# Patient Record
Sex: Female | Born: 1980 | Hispanic: No | Marital: Married | Smoking: Never smoker
Health system: Southern US, Community
[De-identification: ages and names within clinical notes are randomized; demographics above are authoritative.]

## PROBLEM LIST (undated history)

## (undated) ENCOUNTER — Inpatient Hospital Stay (HOSPITAL_COMMUNITY): Payer: Self-pay

## (undated) DIAGNOSIS — Z789 Other specified health status: Secondary | ICD-10-CM

## (undated) HISTORY — PX: NO PAST SURGERIES: SHX2092

---

## 2015-07-29 ENCOUNTER — Ambulatory Visit (INDEPENDENT_AMBULATORY_CARE_PROVIDER_SITE_OTHER): Payer: Medicaid Other | Admitting: Certified Nurse Midwife

## 2015-07-29 ENCOUNTER — Encounter: Payer: Self-pay | Admitting: Certified Nurse Midwife

## 2015-07-29 VITALS — BP 119/80 | HR 98 | Temp 98.7°F | Wt 124.0 lb

## 2015-07-29 DIAGNOSIS — O0932 Supervision of pregnancy with insufficient antenatal care, second trimester: Secondary | ICD-10-CM

## 2015-07-29 DIAGNOSIS — Z3492 Encounter for supervision of normal pregnancy, unspecified, second trimester: Secondary | ICD-10-CM

## 2015-07-29 DIAGNOSIS — O09519 Supervision of elderly primigravida, unspecified trimester: Secondary | ICD-10-CM | POA: Insufficient documentation

## 2015-07-29 DIAGNOSIS — O09512 Supervision of elderly primigravida, second trimester: Secondary | ICD-10-CM

## 2015-07-29 DIAGNOSIS — Z3402 Encounter for supervision of normal first pregnancy, second trimester: Secondary | ICD-10-CM

## 2015-07-29 LAB — POCT URINALYSIS DIPSTICK
Bilirubin, UA: NEGATIVE
Glucose, UA: NEGATIVE
KETONES UA: NEGATIVE
Leukocytes, UA: NEGATIVE
Nitrite, UA: NEGATIVE
PROTEIN UA: NEGATIVE
RBC UA: NEGATIVE
SPEC GRAV UA: 1.015
Urobilinogen, UA: NEGATIVE
pH, UA: 6

## 2015-07-29 MED ORDER — ONDANSETRON HCL 8 MG PO TABS: 8.0000 mg | ORAL_TABLET | Freq: Three times a day (TID) | ORAL | Status: DC | PRN

## 2015-07-29 MED ORDER — VITAFOL FE+ 90-1-200 & 50 MG PO CPPK: 2.0000 | ORAL_CAPSULE | Freq: Every day | ORAL | Status: AC

## 2015-07-29 NOTE — Progress Notes (Signed)
Subjective:    [name] [name] is being seen today for her first obstetrical visit.  This is not a planned pregnancy. She is at unknown gestation. Her obstetrical history is significant for advanced maternal age. Relationship with FOB: spouse, living together. Patient does not intend to breast feed. Pregnancy history fully reviewed.  Has been in [place] for about 3-4 months, came here in December 2016.    The information documented in the HPI was reviewed and verified.  Menstrual History: OB History    Gravida Para Term Preterm AB TAB SAB Ectopic Multiple Living   1 0 0 0 0 0 0 0 0       Menarche age: 35 years of age.    Patient's last menstrual period was 03/24/2015 (approximate).    No past medical history on file.  No past surgical history on file.   (Not in a hospital admission) No Known Allergies  Social History  Substance Use Topics  . Smoking status: Not on file  . Smokeless tobacco: Not on file  . Alcohol Use: Not on file    No family history on file.   Review of Systems Constitutional: negative for weight loss Gastrointestinal: negative for vomiting, + nausea Genitourinary:negative for genital lesions and vaginal discharge and dysuria Musculoskeletal:negative for back pain Behavioral/Psych: negative for abusive relationship, depression, illegal drug usage and tobacco use    Objective:    BP 119/80 mmHg  Pulse 98  Temp(Src) 98.7 F (37.1 C)  Wt 124 lb (56.246 kg)  LMP 03/24/2015 (Approximate) General Appearance:    Alert, cooperative, no distress, appears stated age  Head:    Normocephalic, without obvious abnormality, atraumatic  Eyes:    PERRL, conjunctiva/corneas clear, EOM's intact, fundi    benign, both eyes  Ears:    Normal TM's and external ear canals, both ears  Nose:   Nares normal, septum midline, mucosa normal, no drainage    or sinus tenderness  Throat:   Lips, mucosa, and tongue normal; teeth and gums normal  Neck:   Supple, symmetrical, trachea midline,  no adenopathy;    thyroid:  no enlargement/tenderness/nodules; no carotid   bruit or JVD  Back:     Symmetric, no curvature, ROM normal, no CVA tenderness  Lungs:     Clear to auscultation bilaterally, respirations unlabored  Chest Wall:    No tenderness or deformity   Heart:    Regular rate and rhythm, S1 and S2 normal, no murmur, rub   or gallop  Breast Exam:    No tenderness, masses, or nipple abnormality  Abdomen:     Soft, non-tender, bowel sounds active all four quadrants,    no masses, no organomegaly  Genitalia:    Normal female without lesion, discharge or tenderness  Extremities:   Extremities normal, atraumatic, no cyanosis or edema  Pulses:   2+ and symmetric all extremities  Skin:   Skin color, texture, turgor normal, no rashes or lesions  Lymph nodes:   Cervical, supraclavicular, and axillary nodes normal  Neurologic:   CNII-XII intact, normal strength, sensation and reflexes    throughout         Cervix:  Long, thick, closed and posterior.   FHR: 150, FH: 18-20cm @U .     Lab Review Urine pregnancy test Labs reviewed no Radiologic studies reviewed no Assessment:    Pregnancy at unknown weeks   Late to prenatal care  Immigration this year from [place], non english speaking  AMA   Plan:  Prenatal vitamins.  Counseling provided regarding continued use of seat belts, cessation of alcohol consumption, smoking or use of illicit drugs; infection precautions i.e., influenza/TDAP immunizations, toxoplasmosis,CMV, parvovirus, listeria and varicella; workplace safety, exercise during pregnancy; routine dental care, safe medications, sexual activity, hot tubs, saunas, pools, travel, caffeine use, fish and methlymercury, potential toxins, hair treatments, varicose veins Weight gain recommendations per IOM guidelines reviewed: underweight/BMI< 18.5--> gain 28 - 40 lbs; normal weight/BMI 18.5 - 24.9--> gain 25 - 35 lbs; overweight/BMI 25 - 29.9--> gain 15 - 25 lbs; obese/BMI  >30->gain  11 - 20 lbs Problem list reviewed and updated. FIRST/CF mutation testing/NIPT/QUAD SCREEN/fragile X/Ashkenazi Jewish population testing/Spinal muscular atrophy discussed: requested. Role of ultrasound in pregnancy discussed; fetal survey: requested. Amniocentesis discussed: not indicated. VBAC calculator score: VBAC consent form provided Meds ordered this encounter  Medications  . Prenat-FePoly-Metf-FA-DHA-DSS (VITAFOL FE+) 90-1-200 & 50 MG CPPK    Sig: Take 2 tablets by mouth daily.    Dispense:  60 each    Refill:  12  . ondansetron (ZOFRAN) 8 MG tablet    Sig: Take 1 tablet (8 mg total) by mouth every 8 (eight) hours as needed for nausea or vomiting.    Dispense:  40 tablet    Refill:  2   Orders Placed This Encounter  Procedures  . Culture, OB Urine  . US OB Comp + 14 Wk    Standing Status: Future     Number of Occurrences:      Standing Expiration Date: 09/27/2016    Order Specific Question:  Reason for Exam (SYMPTOM  OR DIAGNOSIS REQUIRED)    Answer:  fetal anatomy scAN, DATING    Order Specific Question:  Preferred imaging location?    Answer:  Internal  . US MFM OB DETAIL +14 WK    Standing Status: Future     Number of Occurrences:      Standing Expiration Date: 09/27/2016    Order Specific Question:  Reason for Exam (SYMPTOM  OR DIAGNOSIS REQUIRED)    Answer:  fetal anatomy scan, dating    Order Specific Question:  Preferred imaging location?    Answer:  [facility]-Ultrasound  . Prenatal Profile I  . Varicella zoster antibody, IgG  . Hemoglobinopathy evaluation  . HIV antibody  . TSH  . MaterniT21 PLUS Core+SCA    Order Specific Question:  Is the patient insulin dependent?    Answer:  No    Order Specific Question:  Please enter gestational age. This should be expressed as weeks AND days, i.e. 16w 6d. Enter weeks here. Enter days in next question.    Answer:  6918     Comments:  does not have US yet    Order Specific Question:  Please enter gestational age. This  should be expressed as weeks AND days, i.e. 16w 6d. Enter days here. Enter weeks in previous question.    Answer:  1    Order Specific Question:  How was gestational age calculated?    Answer:  LMP     Comments:  fundal height    Order Specific Question:  Please give the date of LMP OR Ultrasound OR Estimated date of delivery.    Answer:  03/24/2015    Order Specific Question:  Number of Fetuses (Type of Pregnancy):    Answer:  1    Order Specific Question:  Indications for performing the test? (please choose all that apply):    Answer:  Advanced maternal age    Order  Specific Question:  Other Indications? (Y=Yes, N=No)    Answer:  Y    Order Specific Question:  Please specify other indications, if any:    Answer:  [place], new to [place]    Order Specific Question:  If this is a repeat specimen, please indicate the reason:    Answer:  Not indicated    Order Specific Question:  Please specify the patient's race: (C=White/Caucasion, B=Black, I=Native American, A=Asian, H=Hispanic, O=Other, U=Unknown)    Answer:  O    Order Specific Question:  Donor Egg - indicate if the egg was obtained from in vitro fertilization.    Answer:  N    Order Specific Question:  Age of Egg Donor.    Answer:  70    Order Specific Question:  Prior Down Syndrome/ONTD screening during current pregnancy.    Answer:  N    Order Specific Question:  Prior First Trimester Testing    Answer:  N    Order Specific Question:  Prior Second Trimester Testing    Answer:  N    Order Specific Question:  Family History of Neural Tube Defects    Answer:  N    Order Specific Question:  Prior Pregnancy with Down Syndrome    Answer:  N    Order Specific Question:  Please give the patient's weight (in pounds)    Answer:  124  . POCT Urinalysis Dipstick    Follow up in 4 weeks. 50% of 30 min visit spent on counseling and coordination of care.

## 2015-07-30 LAB — HIV ANTIBODY (ROUTINE TESTING W REFLEX): HIV Screen 4th Generation wRfx: NONREACTIVE

## 2015-07-30 LAB — PRENATAL PROFILE I(LABCORP)
Antibody Screen: NEGATIVE
BASOS ABS: 0 10*3/uL (ref 0.0–0.2)
Basos: 0 %
EOS (ABSOLUTE): 1.5 10*3/uL — ABNORMAL HIGH (ref 0.0–0.4)
Eos: 15 %
HEMOGLOBIN: 12.3 g/dL (ref 11.1–15.9)
Hematocrit: 36.3 % (ref 34.0–46.6)
Hepatitis B Surface Ag: NEGATIVE
Immature Grans (Abs): 0 10*3/uL (ref 0.0–0.1)
Immature Granulocytes: 0 %
LYMPHS ABS: 1.5 10*3/uL (ref 0.7–3.1)
Lymphs: 15 %
MCH: 30.2 pg (ref 26.6–33.0)
MCHC: 33.9 g/dL (ref 31.5–35.7)
MCV: 89 fL (ref 79–97)
MONOS ABS: 0.5 10*3/uL (ref 0.1–0.9)
Monocytes: 5 %
NEUTROS ABS: 6.5 10*3/uL (ref 1.4–7.0)
Neutrophils: 65 %
PLATELETS: 240 10*3/uL (ref 150–379)
RBC: 4.07 x10E6/uL (ref 3.77–5.28)
RDW: 13.1 % (ref 12.3–15.4)
RH TYPE: POSITIVE
RPR Ser Ql: NONREACTIVE
Rubella Antibodies, IGG: 6.93 index (ref >0.99)
WBC: 10.1 10*3/uL (ref 3.4–10.8)

## 2015-07-30 LAB — VARICELLA ZOSTER ANTIBODY, IGG: Varicella zoster IgG: 386 index (ref >165)

## 2015-07-30 LAB — HEMOGLOBINOPATHY EVALUATION
HEMOGLOBIN A2 QUANTITATION: 2.7 % (ref 0.7–3.1)
HEMOGLOBIN F QUANTITATION: 0 % (ref 0.0–2.0)
HGB C: 0 %
HGB S: 0 %
Hgb A: 97.3 % (ref 94.0–98.0)

## 2015-07-30 LAB — TSH: TSH: 3.06 u[IU]/mL (ref 0.450–4.500)

## 2015-07-30 NOTE — Addendum Note (Signed)
Addended by: Marya LandryFOSTER, Jenisis Harmsen D on: 07/30/2015 09:34 AM   Modules accepted: Orders

## 2015-07-31 ENCOUNTER — Encounter (HOSPITAL_COMMUNITY): Payer: Self-pay | Admitting: Certified Nurse Midwife

## 2015-07-31 LAB — CULTURE, OB URINE

## 2015-07-31 LAB — URINE CULTURE, OB REFLEX: Organism ID, Bacteria: NO GROWTH

## 2015-08-02 LAB — PAP IG AND HPV HIGH-RISK
HPV, high-risk: NEGATIVE
PAP Smear Comment: 0

## 2015-08-03 LAB — NUSWAB VG+, CANDIDA 6SP
CANDIDA ALBICANS, NAA: NEGATIVE
CANDIDA KRUSEI, NAA: NEGATIVE
Candida glabrata, NAA: NEGATIVE
Candida lusitaniae, NAA: NEGATIVE
Candida parapsilosis, NAA: NEGATIVE
Candida tropicalis, NAA: NEGATIVE
Chlamydia trachomatis, NAA: NEGATIVE
Neisseria gonorrhoeae, NAA: NEGATIVE
Trich vag by NAA: NEGATIVE

## 2015-08-05 LAB — MATERNIT21 PLUS CORE+SCA
CHROMOSOME 18: NEGATIVE
CHROMOSOME 21: NEGATIVE
Chromosome 13: NEGATIVE
PDF: 0
Y Chromosome: NOT DETECTED

## 2015-08-06 ENCOUNTER — Other Ambulatory Visit: Payer: Self-pay | Admitting: Certified Nurse Midwife

## 2015-08-08 ENCOUNTER — Encounter (HOSPITAL_COMMUNITY): Payer: Self-pay

## 2015-08-11 ENCOUNTER — Ambulatory Visit (HOSPITAL_COMMUNITY)
Admission: RE | Admit: 2015-08-11 | Discharge: 2015-08-11 | Disposition: A | Discharge status: Home / Self Care | Payer: Medicaid Other | Source: Ambulatory Visit | Attending: Certified Nurse Midwife | Admitting: Certified Nurse Midwife

## 2015-08-11 ENCOUNTER — Other Ambulatory Visit: Payer: Self-pay | Admitting: Certified Nurse Midwife

## 2015-08-11 ENCOUNTER — Encounter (HOSPITAL_COMMUNITY): Payer: Self-pay

## 2015-08-11 DIAGNOSIS — Z315 Encounter for genetic counseling: Secondary | ICD-10-CM | POA: Insufficient documentation

## 2015-08-11 DIAGNOSIS — Z3A20 20 weeks gestation of pregnancy: Secondary | ICD-10-CM | POA: Diagnosis not present

## 2015-08-11 DIAGNOSIS — Z3689 Encounter for other specified antenatal screening: Secondary | ICD-10-CM

## 2015-08-11 DIAGNOSIS — O09512 Supervision of elderly primigravida, second trimester: Secondary | ICD-10-CM

## 2015-08-11 DIAGNOSIS — Z36 Encounter for antenatal screening of mother: Secondary | ICD-10-CM | POA: Insufficient documentation

## 2015-08-11 NOTE — Progress Notes (Signed)
Genetic Counseling  High-Risk Gestation Note  Appointment Date:  08/11/2015 Referred By: Roe Coombsenney, Rachelle A, CNM Date of Birth:  January 20, 1981 Partner: Elmo PuttBlon [name]   Pregnancy History: G1P0000 Estimated Date of Delivery: 12/29/15 Estimated Gestational Age: 4132w0d Attending: Damaris HippoJeffrey Denney, MD  [name] and her husband, Mr. [name] [name], were seen for genetic counseling because of a maternal age of 35 y.o.Marland Kitchen. Vietnamese/English interpreter, [name] [name], from [employer] provided interpretation for today's visit.     In summary:  Discussed AMA and associated risk for fetal aneuploidy  Reviewed results of noninvasive prenatal screening (MaterniT21) through OB, which were within normal limits  Discussed additional option for screening  Ultrasound- performed today, within normal limits  Discussed diagnostic testing options  Amniocentesis-patient declined  Reviewed family history concerns: Advanced paternal age (37 years old)  Discussed carrier screening options (including CF)- couple declined additional, optional carrier screening at this time  They were counseled regarding maternal age and the association with risk for chromosome conditions due to nondisjunction with aging of the ova.   We reviewed chromosomes, nondisjunction, and the associated 1 in 2988 risk for fetal aneuploidy related to a maternal age of 35 years old at delivery.  They were counseled that the risk for aneuploidy decreases as gestational age increases, accounting for those pregnancies which spontaneously abort.  We specifically discussed Down syndrome (trisomy 6121), trisomies 5113 and 3318, and sex chromosome aneuploidies (47,XXX and 47,XXY) including the common features and prognoses of each.   We also reviewed the results of [name]'s non-invasive prenatal screening (NIPS), MaterniT21 performed through her OB office.  We discussed that NIPS analyzes placental cell free DNA in maternal circulation to evaluate for the  presence of extra chromosome conditions.  Thus, it is able to provide risk assessment for specific chromosome conditions, but is not diagnostic.  Based on her test, the chance for her baby to have aneuploidy for chromosomes 13, 18 or 21 was reduced to less than 1 in [id number], and the risk for sex chromosome aneuploidy was low risk.  Detailed ultrasound was performed today. Complete ultrasound results reported under separate cover. They were counseled that 50-80% of fetuses with Down syndrome and up to 90% of fetuses with trisomies 13 and 18, when well visualized, have detectable anomalies or soft markers by ultrasound.  We reviewed that ultrasound does not diagnose or rule out chromosome conditions.   We also discussed the availability of diagnostic testing by way of amniocentesis.  We reviewed the risks, benefits and limitations of amniocentesis including the approximate 1 in 300-500 risk for pregnancy complications following amniocentesis. We discussed the possible results that the tests might provide including: positive, negative, unanticipated, and no result. Finally, they were counseled regarding the cost of each option and potential out of pocket expenses. After reviewing the above results and the available options, [name] expressed that she is not interested in pursuing diagnostic testing at this time or in the future, given the associated risk of complications and given that the screening performed thus far have been within normal range.  They understand that ultrasound and NIPS cannot rule out all birth defects or genetic syndromes.    [name] was provided with written information regarding cystic fibrosis (CF) including the carrier frequency and incidence in the Asian population, the availability of carrier testing and prenatal diagnosis if indicated.  In addition, we discussed that CF is routinely screened for as part of the [place] newborn screening panel.  She declined  CF testing today.    Both family histories were reviewed and found to be noncontributory for birth defects, intellectual disability, and known genetic conditions. Without further information regarding the provided family history, an accurate genetic risk cannot be calculated. Further genetic counseling is warranted if more information is obtained.  The father of the pregnancy is 66 years old. Advanced paternal age (APA) is defined as paternal age greater than or equal to age 10.  Recent large-scale sequencing studies have shown that approximately 80% of de novo point mutations are of paternal origin.  Many studies have demonstrated a strong correlation between increased paternal age and de novo point mutations.  Although no specific data is available regarding fetal risks for fathers 66+ years old at conception, it is apparent that the overall risk for single gene conditions is increased.  To estimate the relative increase in risk of a genetic disorder with APA, the heritability of the disease must be considered.  Assuming an approximate 2x increase in risk for conditions that are exclusively paternal in origin, the risk for each individual condition is still relatively low.  It is estimated that the overall chance for a de novo mutation is ~0.5%.  We also discussed the wide range of conditions which can be caused by new dominant gene mutations (achondroplasia, neurofibromatosis, Marfan syndrome etc.).  They were counseled that genetic testing for each individual single gene condition is not warranted or available unless ultrasound or family history concerns lend suspicion to a specific condition.  We discussed the option of a follow up ultrasound at ~28 weeks to monitor fetal growth.  [name] denied exposure to environmental toxins or chemical agents. She denied the use of alcohol, tobacco or street drugs. She denied significant viral illnesses during the course of her pregnancy. Her medical and surgical histories were  noncontributory.   I counseled this couple regarding the above risks and available options.  The approximate face-to-face time with the genetic counselor was 35 minutes.  Quinn Plowman, MS,  Certified [employer] 08/11/2015

## 2015-08-12 ENCOUNTER — Other Ambulatory Visit: Payer: Self-pay | Admitting: Certified Nurse Midwife

## 2015-08-26 ENCOUNTER — Encounter: Payer: Medicaid Other | Admitting: Certified Nurse Midwife

## 2015-08-28 ENCOUNTER — Ambulatory Visit (INDEPENDENT_AMBULATORY_CARE_PROVIDER_SITE_OTHER): Payer: Medicaid Other | Admitting: Certified Nurse Midwife

## 2015-08-28 VITALS — BP 118/82 | HR 101 | Wt 132.0 lb

## 2015-08-28 DIAGNOSIS — O0992 Supervision of high risk pregnancy, unspecified, second trimester: Secondary | ICD-10-CM

## 2015-08-28 DIAGNOSIS — O0932 Supervision of pregnancy with insufficient antenatal care, second trimester: Secondary | ICD-10-CM

## 2015-08-28 LAB — POCT URINALYSIS DIPSTICK
BILIRUBIN UA: NEGATIVE
Blood, UA: NEGATIVE
GLUCOSE UA: NEGATIVE
KETONES UA: NEGATIVE
Nitrite, UA: NEGATIVE
PROTEIN UA: NEGATIVE
Spec Grav, UA: <=1.005
Urobilinogen, UA: NEGATIVE
pH, UA: 7

## 2015-08-28 NOTE — Addendum Note (Signed)
Addended by: Rushie ChestnutMCINTYRE, DIRECE E on: 08/28/2015 05:38 PM   Modules accepted: Orders

## 2015-08-28 NOTE — Progress Notes (Signed)
Subjective:    [name] [name] is a 35 y.o. female being seen today for her obstetrical visit. She is at 4671w3d gestation. Patient reports: no complaints . Fetal movement: normal.  Problem List Items Addressed This Visit    None     Patient Active Problem List   Diagnosis Date Noted  . Advanced maternal age, 1st pregnancy 07/29/2015   Objective:    BP 118/82 mmHg  Pulse 101  Wt 132 lb (59.875 kg)  LMP 03/24/2015 (Approximate) FHT: 150 BPM  Uterine Size: size equals dates     Assessment:    Pregnancy @ 5871w3d    AMA   Plan:   F/U US for growth around July 7th  OBGCT: discussed. Signs and symptoms of preterm labor: discussed.  Labs, problem list reviewed and updated 2 hr GTT planned Follow up in 4 weeks.

## 2015-09-11 ENCOUNTER — Telehealth: Payer: Self-pay | Admitting: *Deleted

## 2015-09-11 NOTE — Telephone Encounter (Signed)
Request call back- no reason given 6/22 9:13 request call back- no reason given. 11:17 LM on VM to CB

## 2015-09-24 ENCOUNTER — Encounter (HOSPITAL_COMMUNITY): Payer: Self-pay

## 2015-09-24 ENCOUNTER — Ambulatory Visit (HOSPITAL_COMMUNITY)
Admission: RE | Admit: 2015-09-24 | Discharge: 2015-09-24 | Disposition: A | Discharge status: Home / Self Care | Payer: Medicaid Other | Source: Ambulatory Visit | Attending: Certified Nurse Midwife | Admitting: Certified Nurse Midwife

## 2015-09-24 ENCOUNTER — Other Ambulatory Visit (HOSPITAL_COMMUNITY): Payer: Self-pay | Admitting: Obstetrics and Gynecology

## 2015-09-24 DIAGNOSIS — Z3A26 26 weeks gestation of pregnancy: Secondary | ICD-10-CM

## 2015-09-24 DIAGNOSIS — O09512 Supervision of elderly primigravida, second trimester: Secondary | ICD-10-CM | POA: Insufficient documentation

## 2015-09-24 DIAGNOSIS — Z36 Encounter for antenatal screening of mother: Secondary | ICD-10-CM | POA: Diagnosis not present

## 2015-09-24 HISTORY — DX: Other specified health status: Z78.9

## 2015-09-25 ENCOUNTER — Ambulatory Visit (INDEPENDENT_AMBULATORY_CARE_PROVIDER_SITE_OTHER): Payer: Medicaid Other | Admitting: Certified Nurse Midwife

## 2015-09-25 VITALS — BP 99/69 | HR 109 | Temp 98.1°F | Wt 138.2 lb

## 2015-09-25 DIAGNOSIS — Z3402 Encounter for supervision of normal first pregnancy, second trimester: Secondary | ICD-10-CM

## 2015-09-25 DIAGNOSIS — O09512 Supervision of elderly primigravida, second trimester: Secondary | ICD-10-CM

## 2015-09-25 LAB — POCT URINALYSIS DIPSTICK
Bilirubin, UA: NEGATIVE
Blood, UA: NEGATIVE
Glucose, UA: NEGATIVE
Ketones, UA: NEGATIVE
Leukocytes, UA: NEGATIVE
Nitrite, UA: NEGATIVE
Urobilinogen, UA: NEGATIVE
pH, UA: >=9

## 2015-09-25 NOTE — Progress Notes (Signed)
Subjective:    [name] [name] is a 35 y.o. female being seen today for her obstetrical visit. She is at 5369w3d gestation. Patient reports: no complaints . Fetal movement: normal.  Here for exam with interpreter.  Peds list given to patient.    Problem List Items Addressed This Visit    None    Visit Diagnoses    Encounter for supervision of normal first pregnancy in second trimester    -  Primary    Relevant Orders    POCT urinalysis dipstick (Completed)      Patient Active Problem List   Diagnosis Date Noted  . Advanced maternal age, 1st pregnancy 07/29/2015   Objective:    BP 99/69 mmHg  Pulse 109  Temp(Src) 98.1 F (36.7 C)  Wt 138 lb 3.2 oz (62.687 kg)  LMP 03/24/2015 (Approximate) FHT: 140 BPM  Uterine Size: size equals dates     Assessment:    Pregnancy @ 3969w3d    Doing well   Plan:    OBGCT: discussed and ordered for next visit. Signs and symptoms of preterm labor: discussed.  Labs, problem list reviewed and updated 2 hr GTT planned Follow up in 3 weeks.

## 2015-10-09 ENCOUNTER — Other Ambulatory Visit: Payer: Medicaid Other

## 2015-10-09 ENCOUNTER — Ambulatory Visit (INDEPENDENT_AMBULATORY_CARE_PROVIDER_SITE_OTHER): Payer: Medicaid Other | Admitting: Certified Nurse Midwife

## 2015-10-09 VITALS — BP 113/79 | HR 81 | Temp 98.3°F | Wt 140.2 lb

## 2015-10-09 DIAGNOSIS — Z3403 Encounter for supervision of normal first pregnancy, third trimester: Secondary | ICD-10-CM

## 2015-10-09 LAB — POCT URINALYSIS DIPSTICK
Bilirubin, UA: NEGATIVE
Glucose, UA: 250
Ketones, UA: NEGATIVE
NITRITE UA: NEGATIVE
PH UA: 5
RBC UA: 50
Spec Grav, UA: 1.015
UROBILINOGEN UA: NEGATIVE

## 2015-10-09 NOTE — Progress Notes (Signed)
Subjective:    [name] [name] is a 35 y.o. female being seen today for her obstetrical visit. She is at 3455w3d gestation. Patient reports fatigue, no bleeding, no contractions, no cramping and no leaking. Fetal movement: normal.  Has not picked pediatrician yet.  Interpreter present for exam.  Problem List Items Addressed This Visit    None    Visit Diagnoses    Encounter for supervision of normal first pregnancy in third trimester    -  Primary    Relevant Orders    POCT urinalysis dipstick (Completed)      Patient Active Problem List   Diagnosis Date Noted  . Advanced maternal age, 1st pregnancy 07/29/2015   Objective:    BP 113/79 mmHg  Pulse 81  Temp(Src) 98.3 F (36.8 C)  Wt 140 lb 3.2 oz (63.594 kg)  LMP 03/24/2015 (Approximate) FHT:  150 BPM  Uterine Size: size equals dates  Presentation: unsure     Assessment:    Pregnancy @ 1155w3d weeks  Doing well. Plan:     labs reviewed, problem list updated Consent signed. GBS sent TDAP offered  Rhogam given for RH negative Pediatrician: discussed. Infant feeding: plans to breastfeed. Maternity leave: discussed. Orders Placed This Encounter  Procedures  . POCT urinalysis dipstick   No orders of the defined types were placed in this encounter.   Follow up in 2 Weeks.

## 2015-10-09 NOTE — Progress Notes (Signed)
I agree with note by NP Student Andrew Brake.  Was present for exam.  R.Dalton Molesworth CNM 

## 2015-10-09 NOTE — Progress Notes (Signed)
Patient reports she is doing well- she does not have concerns today.

## 2015-10-10 ENCOUNTER — Other Ambulatory Visit: Payer: Self-pay | Admitting: Certified Nurse Midwife

## 2015-10-10 DIAGNOSIS — D509 Iron deficiency anemia, unspecified: Secondary | ICD-10-CM

## 2015-10-10 LAB — GLUCOSE TOLERANCE, 2 HOURS W/ 1HR
Glucose, 1 hour: 152 mg/dL (ref 65–179)
Glucose, 2 hour: 77 mg/dL (ref 65–152)
Glucose, Fasting: 73 mg/dL (ref 65–91)

## 2015-10-10 LAB — CBC
HEMOGLOBIN: 10.8 g/dL — AB (ref 11.1–15.9)
Hematocrit: 33.7 % — ABNORMAL LOW (ref 34.0–46.6)
MCH: 28.6 pg (ref 26.6–33.0)
MCHC: 32 g/dL (ref 31.5–35.7)
MCV: 89 fL (ref 79–97)
PLATELETS: 256 10*3/uL (ref 150–379)
RBC: 3.78 x10E6/uL (ref 3.77–5.28)
RDW: 13.5 % (ref 12.3–15.4)
WBC: 8.1 10*3/uL (ref 3.4–10.8)

## 2015-10-10 LAB — SYPHILIS: RPR W/REFLEX TO RPR TITER AND TREPONEMAL ANTIBODIES, TRADITIONAL SCREENING AND DIAGNOSIS ALGORITHM: RPR Ser Ql: NONREACTIVE

## 2015-10-10 LAB — HIV ANTIBODY (ROUTINE TESTING W REFLEX): HIV Screen 4th Generation wRfx: NONREACTIVE

## 2015-10-10 MED ORDER — IRON POLYSACCH CMPLX-B12-FA 150-0.025-1 MG PO CAPS: 1.0000 | ORAL_CAPSULE | Freq: Every day | ORAL | Status: DC

## 2015-10-16 ENCOUNTER — Telehealth: Payer: Self-pay | Admitting: *Deleted

## 2015-10-17 NOTE — Telephone Encounter (Signed)
Patient has been in office since call

## 2015-10-23 ENCOUNTER — Ambulatory Visit (INDEPENDENT_AMBULATORY_CARE_PROVIDER_SITE_OTHER): Payer: Medicaid Other | Admitting: Obstetrics

## 2015-10-23 VITALS — BP 109/76 | HR 131 | Temp 98.1°F | Wt 143.7 lb

## 2015-10-23 DIAGNOSIS — Z3403 Encounter for supervision of normal first pregnancy, third trimester: Secondary | ICD-10-CM

## 2015-10-27 ENCOUNTER — Encounter: Payer: Self-pay | Admitting: Obstetrics

## 2015-10-27 NOTE — Progress Notes (Signed)
Subjective:    [name] [name] is a 35 y.o. female being seen today for her obstetrical visit. She is at 4278w0d gestation. Patient reports no complaints. Fetal movement: normal.  Problem List Items Addressed This Visit    None    Visit Diagnoses   None.    Patient Active Problem List   Diagnosis Date Noted  . Advanced maternal age, 1st pregnancy 07/29/2015   Objective:    BP 109/76   Pulse (!) 131   Temp 98.1 F (36.7 C)   Wt 143 lb 11.2 oz (65.2 kg)   LMP 03/24/2015 (Approximate)  FHT:  150 BPM  Uterine Size: size equals dates  Presentation: unsure     Assessment:    Pregnancy @ 4978w0d weeks   Plan:     labs reviewed, problem list updated Consent signed. GBS sent TDAP offered  Rhogam given for RH negative Pediatrician: discussed. Infant feeding: plans to breastfeed. Maternity leave: discussed.  No orders of the defined types were placed in this encounter.  No orders of the defined types were placed in this encounter.  Follow up in 2 Weeks.

## 2015-10-30 NOTE — Telephone Encounter (Signed)
Message left that Iron was sent to pharmacy for her to pick up to take with her PNV due to low Hgb.

## 2015-11-05 ENCOUNTER — Encounter (HOSPITAL_COMMUNITY): Payer: Self-pay

## 2015-11-05 ENCOUNTER — Ambulatory Visit (HOSPITAL_COMMUNITY)
Admission: RE | Admit: 2015-11-05 | Discharge: 2015-11-05 | Disposition: A | Discharge status: Home / Self Care | Payer: Medicaid Other | Source: Ambulatory Visit | Attending: Certified Nurse Midwife | Admitting: Certified Nurse Midwife

## 2015-11-05 DIAGNOSIS — Z3A00 Weeks of gestation of pregnancy not specified: Secondary | ICD-10-CM | POA: Insufficient documentation

## 2015-11-05 DIAGNOSIS — O09512 Supervision of elderly primigravida, second trimester: Secondary | ICD-10-CM | POA: Insufficient documentation

## 2015-11-06 ENCOUNTER — Ambulatory Visit (INDEPENDENT_AMBULATORY_CARE_PROVIDER_SITE_OTHER): Payer: Medicaid Other | Admitting: Certified Nurse Midwife

## 2015-11-06 DIAGNOSIS — Z331 Pregnant state, incidental: Secondary | ICD-10-CM

## 2015-11-06 DIAGNOSIS — O09513 Supervision of elderly primigravida, third trimester: Secondary | ICD-10-CM

## 2015-11-06 DIAGNOSIS — Z1389 Encounter for screening for other disorder: Secondary | ICD-10-CM

## 2015-11-06 LAB — POCT URINALYSIS DIPSTICK
BILIRUBIN UA: NEGATIVE
GLUCOSE UA: NEGATIVE
Ketones, UA: NEGATIVE
Leukocytes, UA: NEGATIVE
Nitrite, UA: NEGATIVE
RBC UA: 250
SPEC GRAV UA: 1.01
UROBILINOGEN UA: NEGATIVE
pH, UA: 7

## 2015-11-06 MED ORDER — FUSION PLUS PO CAPS: 1.0000 | ORAL_CAPSULE | Freq: Every day | ORAL | 12 refills | Status: AC

## 2015-11-06 NOTE — Progress Notes (Signed)
Subjective:    [name] [name] is a 35 y.o. female being seen today for her obstetrical visit. She is at 153w3d gestation. Patient reports no complaints. Fetal movement: normal.  Problem List Items Addressed This Visit    None    Visit Diagnoses   None.    Patient Active Problem List   Diagnosis Date Noted  . Advanced maternal age, 1st pregnancy 07/29/2015   Objective:    BP 112/75   Pulse (!) 112   Temp 98.1 F (36.7 C)   Wt 147 lb 4.8 oz (66.8 kg)   LMP 03/24/2015 (Approximate)  FHT:  150 BPM  Uterine Size: 32 cm and size equals dates  Presentation: cephalic     Assessment:    Pregnancy @ 613w3d weeks   Anemia  Language difficulty  AMA  Late to prenatal care   Plan:    If Fusion is not covered, start OTC iron supplementation   labs reviewed, problem list updated Consent signed. GBS planinng TDAP offered  Rhogam given for RH negative Pediatrician: discussed. Infant feeding: plans to breastfeed. Maternity leave: N/A. Cigarette smoking: never smoked. No orders of the defined types were placed in this encounter.  No orders of the defined types were placed in this encounter.  Follow up in 2 Weeks.

## 2015-11-06 NOTE — Addendum Note (Signed)
Addended by: Elby BeckPAUL, JANE F on: 11/06/2015 05:40 PM   Modules accepted: Orders

## 2015-11-06 NOTE — Progress Notes (Signed)
Pt denies concerns at this time. 

## 2015-11-20 ENCOUNTER — Ambulatory Visit (INDEPENDENT_AMBULATORY_CARE_PROVIDER_SITE_OTHER): Payer: Medicaid Other | Admitting: Obstetrics

## 2015-11-20 ENCOUNTER — Encounter: Payer: Self-pay | Admitting: Obstetrics

## 2015-11-20 ENCOUNTER — Inpatient Hospital Stay (HOSPITAL_COMMUNITY)
Admission: AD | Admit: 2015-11-20 | Discharge: 2015-11-20 | Disposition: A | Discharge status: Home / Self Care | Payer: Medicaid Other | Source: Ambulatory Visit | Attending: Obstetrics and Gynecology | Admitting: Obstetrics and Gynecology

## 2015-11-20 ENCOUNTER — Encounter: Payer: Medicaid Other | Admitting: Certified Nurse Midwife

## 2015-11-20 ENCOUNTER — Encounter (HOSPITAL_COMMUNITY): Payer: Self-pay | Admitting: *Deleted

## 2015-11-20 VITALS — BP 111/82 | HR 112 | Temp 98.3°F | Wt 149.6 lb

## 2015-11-20 DIAGNOSIS — Z3A34 34 weeks gestation of pregnancy: Secondary | ICD-10-CM | POA: Insufficient documentation

## 2015-11-20 DIAGNOSIS — Z3A35 35 weeks gestation of pregnancy: Secondary | ICD-10-CM | POA: Diagnosis not present

## 2015-11-20 DIAGNOSIS — O479 False labor, unspecified: Secondary | ICD-10-CM

## 2015-11-20 DIAGNOSIS — O09513 Supervision of elderly primigravida, third trimester: Secondary | ICD-10-CM

## 2015-11-20 DIAGNOSIS — Z3403 Encounter for supervision of normal first pregnancy, third trimester: Secondary | ICD-10-CM

## 2015-11-20 DIAGNOSIS — O4703 False labor before 37 completed weeks of gestation, third trimester: Secondary | ICD-10-CM

## 2015-11-20 LAB — URINALYSIS, ROUTINE W REFLEX MICROSCOPIC
Bilirubin Urine: NEGATIVE
GLUCOSE, UA: NEGATIVE mg/dL
Hgb urine dipstick: NEGATIVE
KETONES UR: NEGATIVE mg/dL
NITRITE: NEGATIVE
PROTEIN: NEGATIVE mg/dL
Specific Gravity, Urine: <1.005 — ABNORMAL LOW (ref 1.005–1.030)
pH: 6.5 (ref 5.0–8.0)

## 2015-11-20 LAB — POCT URINALYSIS DIPSTICK
BILIRUBIN UA: NEGATIVE
Blood, UA: NEGATIVE
Glucose, UA: NEGATIVE
KETONES UA: NEGATIVE
LEUKOCYTES UA: NEGATIVE
NITRITE UA: NEGATIVE
PROTEIN UA: NEGATIVE
Spec Grav, UA: <=1.005
Urobilinogen, UA: 0.2
pH, UA: 8

## 2015-11-20 LAB — URINE MICROSCOPIC-ADD ON

## 2015-11-20 NOTE — Progress Notes (Signed)
Pt c/o irregular contractions. 

## 2015-11-20 NOTE — MAU Note (Signed)
Pt sent from MD office, has been having contractions, more severe today.  Denies bleeding or LOF.

## 2015-11-20 NOTE — Discharge Instructions (Signed)
Braxton Hicks Contractions °Contractions of the uterus can occur throughout pregnancy. Contractions are not always a sign that you are in labor.  °WHAT ARE BRAXTON HICKS CONTRACTIONS?  °Contractions that occur before labor are called Braxton Hicks contractions, or false labor. Toward the end of pregnancy (32-34 weeks), these contractions can develop more often and may become more forceful. This is not true labor because these contractions do not result in opening (dilatation) and thinning of the cervix. They are sometimes difficult to tell apart from true labor because these contractions can be forceful and people have different pain tolerances. You should not feel embarrassed if you go to the hospital with false labor. Sometimes, the only way to tell if you are in true labor is for your health care provider to look for changes in the cervix. °If there are no prenatal problems or other health problems associated with the pregnancy, it is completely safe to be sent home with false labor and await the onset of true labor. °HOW CAN YOU TELL THE DIFFERENCE BETWEEN TRUE AND FALSE LABOR? °False Labor °· The contractions of false labor are usually shorter and not as hard as those of true labor.   °· The contractions are usually irregular.   °· The contractions are often felt in the front of the lower abdomen and in the groin.   °· The contractions may go away when you walk around or change positions while lying down.   °· The contractions get weaker and are shorter lasting as time goes on.   °· The contractions do not usually become progressively stronger, regular, and closer together as with true labor.   °True Labor °· Contractions in true labor last 30-70 seconds, become very regular, usually become more intense, and increase in frequency.   °· The contractions do not go away with walking.   °· The discomfort is usually felt in the top of the uterus and spreads to the lower abdomen and low back.   °· True labor can be  determined by your health care provider with an exam. This will show that the cervix is dilating and getting thinner.   °WHAT TO REMEMBER °· Keep up with your usual exercises and follow other instructions given by your health care provider.   °· Take medicines as directed by your health care provider.   °· Keep your regular prenatal appointments.   °· Eat and drink lightly if you think you are going into labor.   °· If Braxton Hicks contractions are making you uncomfortable:   °¨ Change your position from lying down or resting to walking, or from walking to resting.   °¨ Sit and rest in a tub of warm water.   °¨ Drink 2-3 glasses of water. Dehydration may cause these contractions.   °¨ Do slow and deep breathing several times an hour.   °WHEN SHOULD I SEEK IMMEDIATE MEDICAL CARE? °Seek immediate medical care if: °· Your contractions become stronger, more regular, and closer together.   °· You have fluid leaking or gushing from your vagina.   °· You have a fever.   °· You pass blood-tinged mucus.   °· You have vaginal bleeding.   °· You have continuous abdominal pain.   °· You have low back pain that you never had before.   °· You feel your baby's head pushing down and causing pelvic pressure.   °· Your baby is not moving as much as it used to.   °  °This information is not intended to replace advice given to you by your health care provider. Make sure you discuss any questions you have with your health care   provider. °  °Document Released: 03/08/2005 Document Revised: 03/13/2013 Document Reviewed: 12/18/2012 °Elsevier Interactive Patient Education ©2016 [employer] ° °Preterm Labor Information °Preterm labor is when labor starts at less than 37 weeks of pregnancy. The normal length of a pregnancy is 39 to 41 weeks. °CAUSES °Often, there is no identifiable underlying cause as to why a woman goes into preterm labor. One of the most common known causes of preterm labor is infection. Infections of the uterus, cervix,  vagina, amniotic sac, bladder, kidney, or even the lungs (pneumonia) can cause labor to start. Other suspected causes of preterm labor include:  °· Urogenital infections, such as yeast infections and bacterial vaginosis.   °· Uterine abnormalities (uterine shape, uterine septum, fibroids, or bleeding from the placenta).   °· A cervix that has been operated on (it may fail to stay closed).   °· Malformations in the fetus.   °· Multiple gestations (twins, triplets, and so on).   °· Breakage of the amniotic sac.   °RISK FACTORS °· Having a previous history of preterm labor.   °· Having premature rupture of membranes (PROM).   °· Having a placenta that covers the opening of the cervix (placenta previa).   °· Having a placenta that separates from the uterus (placental abruption).   °· Having a cervix that is too weak to hold the fetus in the uterus (incompetent cervix).   °· Having too much fluid in the amniotic sac (polyhydramnios).   °· Taking illegal drugs or smoking while pregnant.   °· Not gaining enough weight while pregnant.   °· Being younger than 18 and older than 37 years old.   °· Having a low socioeconomic status.   °· Being African American. °SYMPTOMS °Signs and symptoms of preterm labor include:  °· Menstrual-like cramps, abdominal pain, or back pain. °· Uterine contractions that are regular, as frequent as six in an hour, regardless of their intensity (may be mild or painful). °· Contractions that start on the top of the uterus and spread down to the lower abdomen and back.   °· A sense of increased pelvic pressure.   °· A watery or bloody mucus discharge that comes from the vagina.   °TREATMENT °Depending on the length of the pregnancy and other circumstances, your health care provider may suggest bed rest. If necessary, there are medicines that can be given to stop contractions and to mature the fetal lungs. If labor happens before 34 weeks of pregnancy, a prolonged hospital stay may be recommended.  Treatment depends on the condition of both you and the fetus.  °WHAT SHOULD YOU DO IF YOU THINK YOU ARE IN PRETERM LABOR? °Call your health care provider right away. You will need to go to the hospital to get checked immediately. °HOW CAN YOU PREVENT PRETERM LABOR IN FUTURE PREGNANCIES? °You should:  °· Stop smoking if you smoke.  °· Maintain healthy weight gain and avoid chemicals and drugs that are not necessary. °· Be watchful for any type of infection. °· Inform your health care provider if you have a known history of preterm labor. °  °This information is not intended to replace advice given to you by your health care provider. Make sure you discuss any questions you have with your health care provider. °  °Document Released: 05/29/2003 Document Revised: 11/08/2012 Document Reviewed: 04/10/2012 °Elsevier Interactive Patient Education ©2016 [employer] ° °

## 2015-11-20 NOTE — MAU Provider Note (Signed)
History     CSN: [id number]  Arrival date and time: 11/20/15 1724   First Provider Initiated Contact with Patient 11/20/15 1935      Chief Complaint  Patient presents with  . Contractions   HPI [name] [name] is a 35 y.o. G1P0000 at [redacted]w[redacted]d who presents from office for PTL evaluation. Per patient she was seen by Dr. Clearance Coots today & found to be having contractions on the monitor; sent here to be evaluated to labor. Patient denies abdominal pain, feeling contractions, vaginal bleeding, or LOF. Positive fetal movement.  Denies complications with this pregnancy.   OB History    Gravida Para Term Preterm AB Living   1 0 0 0 0     SAB TAB Ectopic Multiple Live Births   0 0 0 0        Past Medical History:  Diagnosis Date  . Medical history non-contributory     Past Surgical History:  Procedure Laterality Date  . NO PAST SURGERIES      No family history on file.  Social History  Substance Use Topics  . Smoking status: Never Smoker  . Smokeless tobacco: Never Used  . Alcohol use No    Allergies: No Known Allergies  Prescriptions Prior to Admission  Medication Sig Dispense Refill Last Dose  . Iron Polysacch Cmplx-B12-FA 150-0.025-1 MG CAPS Take 1 tablet by mouth daily. 30 each 4 Taking  . Iron-FA-B Cmp-C-Biot-Probiotic (FUSION PLUS) CAPS Take 1 tablet by mouth daily. 30 capsule 12   . ondansetron (ZOFRAN) 8 MG tablet Take 1 tablet (8 mg total) by mouth every 8 (eight) hours as needed for nausea or vomiting. (Patient not taking: Reported on 08/28/2015) 40 tablet 2 Not Taking  . Prenat-FePoly-Metf-FA-DHA-DSS (VITAFOL FE+) 90-1-200 & 50 MG CPPK Take 2 tablets by mouth daily. (Patient not taking: Reported on 10/09/2015) 60 each 12 Not Taking    Review of Systems  Constitutional: Negative.   Gastrointestinal: Negative.   Genitourinary: Negative.    Physical Exam   Blood pressure 114/69, pulse 96, temperature 97.9 F (36.6 C), temperature source Oral, resp. rate 18, last  menstrual period 03/24/2015.  Physical Exam  Nursing note and vitals reviewed. Constitutional: She is oriented to person, place, and time. She appears well-developed and well-nourished. No distress.  HENT:  Head: Normocephalic and atraumatic.  Eyes: Conjunctivae are normal. Right eye exhibits no discharge. Left eye exhibits no discharge. No scleral icterus.  Neck: Normal range of motion.  Respiratory: Effort normal. No respiratory distress.  GI: Soft. There is no tenderness.  Neurological: She is alert and oriented to person, place, and time.  Skin: Skin is warm and dry. She is not diaphoretic.  Psychiatric: She has a normal mood and affect. Her behavior is normal. Judgment and thought content normal.   Dilation: Closed Effacement (%): Thick Cervical Position: Posterior Exam by:: K.Wilson,RN  Fetal Tracing:  Baseline: 140 Variability: moderate Accelerations: 15x15 Decelerations: none  Toco: 3-6 mins   MAU Course  Procedures Results for orders placed or performed during the hospital encounter of 11/20/15 (from the past 24 hour(s))  Urinalysis, Routine w reflex microscopic (not at [facility])     Status: Abnormal   Collection Time: 11/20/15  5:46 PM  Result Value Ref Range   Color, Urine YELLOW YELLOW   APPearance CLEAR CLEAR   Specific Gravity, Urine <1.005 (L) 1.005 - 1.030   pH 6.5 5.0 - 8.0   Glucose, UA NEGATIVE NEGATIVE mg/dL   Hgb urine dipstick NEGATIVE NEGATIVE  Bilirubin Urine NEGATIVE NEGATIVE   Ketones, ur NEGATIVE NEGATIVE mg/dL   Protein, ur NEGATIVE NEGATIVE mg/dL   Nitrite NEGATIVE NEGATIVE   Leukocytes, UA SMALL (A) NEGATIVE  Urine microscopic-add on     Status: Abnormal   Collection Time: 11/20/15  5:46 PM  Result Value Ref Range   Squamous Epithelial / LPF 0-5 (A) NONE SEEN   WBC, UA 0-5 0 - 5 WBC/hpf   RBC / HPF 0-5 0 - 5 RBC/hpf   Bacteria, UA FEW (A) NONE SEEN    MDM Reactive fetal tracing Pt in no pain SVE closed x 2 SVE 1 hour  apart  Assessment and Plan  A: 1. Braxton Hicks contractions     P: Discharge home Preterm labor precautions Keep f/u with OB  Judeth Hornrin Jb Dulworth 11/20/2015, 7:34 PM

## 2015-11-20 NOTE — Progress Notes (Signed)
Subjective:    [name] [name] is a 35 y.o. female being seen today for her obstetrical visit. She is at 5164w3d gestation. Patient reports irregular UC's. Fetal movement: normal.  Problem List Items Addressed This Visit    None    Visit Diagnoses    Encounter for supervision of normal first pregnancy in third trimester    -  Primary   Relevant Orders   POCT Urinalysis Dipstick (Completed)     Patient Active Problem List   Diagnosis Date Noted  . Advanced maternal age, 1st pregnancy 07/29/2015   Objective:    BP 111/82   Pulse (!) 112   Temp 98.3 F (36.8 C)   Wt 149 lb 9.6 oz (67.9 kg)   LMP 03/24/2015 (Approximate)  FHT:  150 BPM  Uterine Size: size equals dates  Presentation: unsure    NST:  UC's q 2-3 minutes.  Reactive.  No decels. Assessment:    Pregnancy @ 264w3d weeks   Preterm UC's  Plan:    [facility]   labs reviewed, problem list updated Consent signed. GBS sent TDAP offered  Rhogam given for RH negative Pediatrician: discussed. Infant feeding: plans to breastfeed. Maternity leave: discussed. Cigarette smoking: never smoked.  Orders Placed This Encounter  Procedures  . POCT Urinalysis Dipstick   No orders of the defined types were placed in this encounter.  Follow up in 1 Week.

## 2015-11-26 ENCOUNTER — Ambulatory Visit (INDEPENDENT_AMBULATORY_CARE_PROVIDER_SITE_OTHER): Payer: Medicaid Other | Admitting: Obstetrics

## 2015-11-26 ENCOUNTER — Encounter: Payer: Self-pay | Admitting: Obstetrics

## 2015-11-26 VITALS — BP 102/70 | HR 103 | Temp 97.7°F | Wt 157.2 lb

## 2015-11-26 DIAGNOSIS — O09513 Supervision of elderly primigravida, third trimester: Secondary | ICD-10-CM

## 2015-11-26 DIAGNOSIS — Z3403 Encounter for supervision of normal first pregnancy, third trimester: Secondary | ICD-10-CM | POA: Diagnosis not present

## 2015-11-26 DIAGNOSIS — Z3493 Encounter for supervision of normal pregnancy, unspecified, third trimester: Secondary | ICD-10-CM

## 2015-11-26 NOTE — Progress Notes (Signed)
Pt states she was not able to get prenatal vitamins and iron pills throughout entire pregnancy.

## 2015-11-27 NOTE — Progress Notes (Signed)
Subjective:    [name] [name] is a 35 y.o. female being seen today for her obstetrical visit. She is at 812w3d gestation. Patient reports no complaints. Fetal movement: normal.  Problem List Items Addressed This Visit    Advanced maternal age, 1st pregnancy    Other Visit Diagnoses    Encounter for supervision of normal first pregnancy in third trimester    -  Primary     Patient Active Problem List   Diagnosis Date Noted  . Advanced maternal age, 1st pregnancy 07/29/2015   Objective:    BP 102/70   Pulse (!) 103   Temp 97.7 F (36.5 C)   Wt 157 lb 3.2 oz (71.3 kg)   LMP 03/24/2015 (Approximate)  FHT:  150 BPM  Uterine Size: size equals dates  Presentation: unsure     Assessment:    Pregnancy @ 2212w3d weeks   Plan:     labs reviewed, problem list updated Consent signed. GBS sent TDAP offered  Rhogam given for RH negative Pediatrician: discussed. Infant feeding: plans to breastfeed. Maternity leave: discussed. Cigarette smoking: never smoked. No orders of the defined types were placed in this encounter.  No orders of the defined types were placed in this encounter.  Follow up in 1 Week.   Patient ID: [name] [name], female   DOB: Jan 21, 1981, 35 y.o.   MRN: [id number]

## 2015-12-24 ENCOUNTER — Inpatient Hospital Stay (HOSPITAL_COMMUNITY)
Admission: AD | Admit: 2015-12-24 | Discharge: 2015-12-24 | Disposition: A | Discharge status: Home / Self Care | Payer: Medicaid Other | Source: Ambulatory Visit | Attending: Obstetrics and Gynecology | Admitting: Obstetrics and Gynecology

## 2015-12-24 ENCOUNTER — Encounter (HOSPITAL_COMMUNITY): Payer: Self-pay

## 2015-12-24 NOTE — MAU Note (Signed)
Pt reports contractions and small amount of bloody mucous. +FM. Denies LOF

## 2015-12-24 NOTE — MAU Note (Signed)
[facility]. Pt was able to contact an interpretor through cell phone that was able to translate for patient for discharge instructions.

## 2015-12-24 NOTE — Discharge Instructions (Signed)
C?n co th?t Braxton Hicks °(Braxton Hicks Contractions) °Co th?t t? cung có th? x?y ra trong su?t thai k?. Co th?t không ph?i lúc nào c?ng là d?u hi?u cho th?y quý v? chuy?n d?.  °C?N CO TH?T BRAXTON HICKS LÀ GÌ?  °C?n co th?t x?y ra tr??c khi sinh ???c g?i là c?n co th?t Braxton Hicks, hay chuy?n d? gi?. V? cu?i thai k? (32-24 tu?n), nh?ng c?n co th?t này x?y ra th??ng xuyên h?n và có th? m?nh h?n. C?n có th?t này không ph?i là chuy?n d? th?c s? vì chúng không làm m? (giãn n?) và làm m?ng c? t? cung. ?ôi khi có th? khó phân bi?t v?i chuy?n d? th?c s? vì nh?ng c?n co th?t này có th? m?nh và m?i ng??i l?i có s?c ch?u ?au khác nhau. [name] ??ng c?m th?y ng??ng ngùng n?u quý v? vào b?nh vi?n khi có chuy?n d? gi?. ?ôi khi, cách duy nh?t ?? bi?t quý v? có chuy?n d? th?t không là ph?i ?? chuyên gia ch?m sóc s?c kh?e phát hi?n nh?ng thay ??i ? c? t? cung. °N?u không có v?n ?? gì tr??c khi sinh ho?c nh?ng v?n ?? s?c kh?e khác liên quan ??n vi?c mang thai, quý v? có th? hoàn toàn an toàn tr? v? nhà khi chuy?n d? gi? và ??i cho ??n khi có c?n chuy?n d? th?t. °[name] CÓ TH? NH?N BI?T S? KHÁC NHAU GI?A CHUY?N D? TH?T VÀ CHUY?N D? GI? NH? TH? NÀO? °Chuy?n d? gi? °· C?n co th?t chuy?n d? gi? th??ng ng?n h?n và không khó ch?u nh? khi chuy?n d? th?t. °· Các c?n co th?t th??ng không ??u. °· Các c?n co th?t th??ng ???c c?m nh?n ? ph?n tr??c b?ng d??i và vùng b?n. °· Các c?n co th?t có th? h?t khi quý v? ?i l?i ho?c thay ??i t? th? khi n?m. °· Các c?n co th?t s? y?u h?n và kéo dài trong th?i gian ng?n h?n theo th?i gian. °· Các c?n co th?t th??ng không ti?n tri?n m?nh lên, ??u, và li?n nhau h?n nh? khi chuy?n d? th?t. °Chuy?n d? th?t °· C?n co th?t khi chuy?n d? th?t kéo dài 30-70 giây, tr? nên r?t ??u, th??ng là m?nh lên, và t?ng t?n su?t. °· C?n co th?t không h?t khi quý v? ?i l?i. °· C?m giác khó ch?u th??ng c?m th?y ? phía trên t? cung và lan t?i vùng b?ng d??i và vùng l?ng d??i. °· Chuyên gia ch?m sóc s?c kh?e có th? khám ?? xác  ??nh chuy?n d? là th?t. Khi khám s? th?y c? t? cung giãn n? và m?ng h?n. °C?N NH? ?I?U GÌ °· Ti?p t?c các bài th? d?c thông th??ng c?a quý v? và làm theo các ch? d?n khác c?a chuyên gia ch?m sóc s?c kh?e. °· S? d?ng thu?c theo ch? d?n c?a chuyên gia ch?m sóc s?c kh?e. °· Ti?p t?c l?ch h?n khám tr??c sinh nh? bình th??ng. °· ?n và u?ng ?? nh? n?u quý v? ngh? quý v? s?p chuy?n d?. °· N?u c?n co th?t Braxton Hicks làm quý v? khó ch?u: °¨ Thay ??i t? t? th? n?m ho?c ngh? ng?i sang ?i l?i, ho?c t? ?i l?i sang ngh? ng?i. °¨ Ng?i và ngh? ng?i trong m?t b?n n??c ?m. °¨ U?ng 2-3 c?c n??c. M?t n??c có th? gây ra nh?ng c?n co th?t nh? v?y. °¨ Th? ch?m và sâu vài l?n trong m?t ti?ng. °KHI NÀO TÔI C?N ?I   KHM NGAY L?P T?C? Hy ?i khm ngay l?p t?c n?u:  Cc c?n co th?t tr? nn m?nh h?n, ??u h?n, v g?n nhau h?n.  Qu v? b? r? d?ch ho?c ti?t d?ch t? m ??o.  Qu v? b? s?t.  Qu v? ra d?ch nh?y c l?n mu.  Qu v? b? ch?y mu m ??o.  Qu v? b? ?au b?ng lin t?c.  Qu v? b? ?au vng th?t l?ng m tr??c ?y ch?a t?ng b? bao gi?[name] [name].  Qu v? c?m th?y ??u c?a con qu v? ??y xu?ng d??i v gy p l?c ln vng khung ch?u.  Con qu v? khng c? ??ng nhi?u nh? tr??c.   Thng tin ny khng nh?m m?c ?ch thay th? cho l?i khuyn m chuyn gia ch?m [name] s?c kh?e ni v?i qu v?. Hy b?o ??m qu v? ph?i th?o lu?n b?t k? v?n ?? g m qu v? c v?i chuyn gia ch?m [name] s?c kh?e c?a qu v?.   Document Released: 03/08/2005 Document Revised: 03/13/2013 Elsevier Interactive Patient Education 2016 Else[employer] Inc[employer]  Fetal Movement Counts Patient Name: __________________________________________________ Patient Due Date: ____________________ Performing a fetal movement count is highly recommended in high-risk pregnancies, but it is good for every pregnant woman to do. Your health care provider may ask you to start counting fetal movements at 28 weeks of the pregnancy. Fetal movements often increase:  After eating a full meal.  After  physical activity.  After eating or drinking something sweet or cold.  At rest. Pay attention to when you feel the baby is most active. This will help you notice a pattern of your baby's sleep and wake cycles and what factors contribute to an increase in fetal movement. It is important to perform a fetal movement count at the same time each day when your baby is normally most active.  HOW TO COUNT FETAL MOVEMENTS 1. Find a quiet and comfortable area to sit or lie down on your left side. Lying on your left side provides the best blood and oxygen circulation to your baby. 2. Write down the day and time on a sheet of paper or in a journal. 3. Start counting kicks, flutters, swishes, rolls, or jabs in a 2-hour period. You should feel at least 10 movements within 2 hours. 4. If you do not feel 10 movements in 2 hours, wait 2-3 hours and count again. Look for a change in the pattern or not enough counts in 2 hours. SEEK MEDICAL CARE IF:  You feel less than 10 counts in 2 hours, tried twice.  There is no movement in over an hour.  The pattern is changing or taking longer each day to reach 10 counts in 2 hours.  You feel the baby is not moving as he or she usually does. Date: ____________ Movements: ____________ Start time: ____________ Doreatha MartinFinish time: ____________  Date: ____________ Movements: ____________ Start time: ____________ Doreatha MartinFinish time: ____________ Date: ____________ Movements: ____________ Start time: ____________ Doreatha MartinFinish time: ____________ Date: ____________ Movements: ____________ Start time: ____________ Doreatha MartinFinish time: ____________ Date: ____________ Movements: ____________ Start time: ____________ Doreatha MartinFinish time: ____________ Date: ____________ Movements: ____________ Start time: ____________ Doreatha MartinFinish time: ____________ Date: ____________ Movements: ____________ Start time: ____________ Doreatha MartinFinish time: ____________ Date: ____________ Movements: ____________ Start time: ____________ Doreatha MartinFinish time:  ____________  Date: ____________ Movements: ____________ Start time: ____________ Doreatha MartinFinish time: ____________ Date: ____________ Movements: ____________ Start time: ____________ Doreatha MartinFinish time: ____________ Date: ____________ Movements: ____________ Start time: ____________ Doreatha MartinFinish time: ____________ Date: ____________ Movements: ____________ Start time: ____________ Doreatha MartinFinish  time: ____________ Date: ____________ Movements: ____________ Start time: ____________ [name] time: ____________ Date: ____________ Movements: ____________ Start time: ____________ [name] time: ____________ Date: ____________ Movements: ____________ Start time: ____________ [name] time: ____________  Date: ____________ Movements: ____________ Start time: ____________ [name] time: ____________ Date: ____________ Movements: ____________ Start time: ____________ [name] time: ____________ Date: ____________ Movements: ____________ Start time: ____________ [name] time: ____________ Date: ____________ Movements: ____________ Start time: ____________ [name] time: ____________ Date: ____________ Movements: ____________ Start time: ____________ [name] time: ____________ Date: ____________ Movements: ____________ Start time: ____________ [name] time: ____________ Date: ____________ Movements: ____________ Start time: ____________ [name] time: ____________  Date: ____________ Movements: ____________ Start time: ____________ [name] time: ____________ Date: ____________ Movements: ____________ Start time: ____________ [name] time: ____________ Date: ____________ Movements: ____________ Start time: ____________ [name] time: ____________ Date: ____________ Movements: ____________ Start time: ____________ [name] time: ____________ Date: ____________ Movements: ____________ Start time: ____________ [name] time: ____________ Date: ____________ Movements: ____________ Start time: ____________ [name] time: ____________ Date: ____________ Movements:  ____________ Start time: ____________ [name] time: ____________  Date: ____________ Movements: ____________ Start time: ____________ [name] time: ____________ Date: ____________ Movements: ____________ Start time: ____________ [name] time: ____________ Date: ____________ Movements: ____________ Start time: ____________ [name] time: ____________ Date: ____________ Movements: ____________ Start time: ____________ [name] time: ____________ Date: ____________ Movements: ____________ Start time: ____________ [name] time: ____________ Date: ____________ Movements: ____________ Start time: ____________ [name] time: ____________ Date: ____________ Movements: ____________ Start time: ____________ [name] time: ____________  Date: ____________ Movements: ____________ Start time: ____________ [name] time: ____________ Date: ____________ Movements: ____________ Start time: ____________ [name] time: ____________ Date: ____________ Movements: ____________ Start time: ____________ [name] time: ____________ Date: ____________ Movements: ____________ Start time: ____________ [name] time: ____________ Date: ____________ Movements: ____________ Start time: ____________ [name] time: ____________ Date: ____________ Movements: ____________ Start time: ____________ [name] time: ____________ Date: ____________ Movements: ____________ Start time: ____________ [name] time: ____________  Date: ____________ Movements: ____________ Start time: ____________ [name] time: ____________ Date: ____________ Movements: ____________ Start time: ____________ [name] time: ____________ Date: ____________ Movements: ____________ Start time: ____________ [name] time: ____________ Date: ____________ Movements: ____________ Start time: ____________ [name] time: ____________ Date: ____________ Movements: ____________ Start time: ____________ [name] time: ____________ Date: ____________ Movements: ____________ Start time: ____________ [name]  time: ____________ Date: ____________ Movements: ____________ Start time: ____________ [name] time: ____________  Date: ____________ Movements: ____________ Start time: ____________ [name] time: ____________ Date: ____________ Movements: ____________ Start time: ____________ [name] time: ____________ Date: ____________ Movements: ____________ Start time: ____________ [name] time: ____________ Date: ____________ Movements: ____________ Start time: ____________ [name] time: ____________ Date: ____________ Movements: ____________ Start time: ____________ [name] time: ____________ Date: ____________ Movements: ____________ Start time: ____________ [name] time: ____________   This information is not intended to replace advice given to you by your health care provider. Make sure you discuss any questions you have with your health care provider.   Document Released: 04/07/2006 Document Revised: 03/29/2014 Document Reviewed: 01/03/2012 Elsevier Interactive Patient Education [employer]

## 2015-12-25 ENCOUNTER — Inpatient Hospital Stay (HOSPITAL_COMMUNITY)
Admission: AD | Admit: 2015-12-25 | Discharge: 2015-12-28 | DRG: 766 | Disposition: A | Discharge status: Home / Self Care | Payer: Medicaid Other | Source: Ambulatory Visit | Attending: Obstetrics and Gynecology | Admitting: Obstetrics and Gynecology

## 2015-12-25 ENCOUNTER — Encounter (HOSPITAL_COMMUNITY): Payer: Self-pay | Admitting: *Deleted

## 2015-12-25 ENCOUNTER — Inpatient Hospital Stay (HOSPITAL_COMMUNITY)
Admission: AD | Admit: 2015-12-25 | Discharge: 2015-12-25 | Disposition: A | Discharge status: Home / Self Care | Payer: Medicaid Other | Source: Ambulatory Visit | Attending: Family Medicine | Admitting: Family Medicine

## 2015-12-25 ENCOUNTER — Encounter (HOSPITAL_COMMUNITY): Payer: Self-pay

## 2015-12-25 DIAGNOSIS — O4202 Full-term premature rupture of membranes, onset of labor within 24 hours of rupture: Secondary | ICD-10-CM | POA: Diagnosis present

## 2015-12-25 DIAGNOSIS — Z880 Allergy status to penicillin: Secondary | ICD-10-CM

## 2015-12-25 DIAGNOSIS — O4290 Premature rupture of membranes, unspecified as to length of time between rupture and onset of labor, unspecified weeks of gestation: Secondary | ICD-10-CM | POA: Diagnosis present

## 2015-12-25 DIAGNOSIS — Z3A39 39 weeks gestation of pregnancy: Secondary | ICD-10-CM

## 2015-12-25 DIAGNOSIS — O324XX0 Maternal care for high head at term, not applicable or unspecified: Principal | ICD-10-CM | POA: Diagnosis present

## 2015-12-25 LAB — CBC
HCT: 33.5 % — ABNORMAL LOW (ref 36.0–46.0)
Hemoglobin: 10.4 g/dL — ABNORMAL LOW (ref 12.0–15.0)
MCH: 24.1 pg — ABNORMAL LOW (ref 26.0–34.0)
MCHC: 31 g/dL (ref 30.0–36.0)
MCV: 77.7 fL — ABNORMAL LOW (ref 78.0–100.0)
Platelets: 246 10*3/uL (ref 150–400)
RBC: 4.31 MIL/uL (ref 3.87–5.11)
RDW: 16.8 % — ABNORMAL HIGH (ref 11.5–15.5)
WBC: 15.2 10*3/uL — ABNORMAL HIGH (ref 4.0–10.5)

## 2015-12-25 MED ORDER — SOD CITRATE-CITRIC ACID 500-334 MG/5ML PO SOLN
30.0000 mL | ORAL | Status: DC | PRN
  Administered 2015-12-26: 30 mL via ORAL
  Filled 2015-12-25: qty 15

## 2015-12-25 MED ORDER — ACETAMINOPHEN 325 MG PO TABS: 650.0000 mg | ORAL_TABLET | ORAL | Status: DC | PRN

## 2015-12-25 MED ORDER — LIDOCAINE HCL (PF) 1 % IJ SOLN: 30.0000 mL | INTRAMUSCULAR | Status: DC | PRN

## 2015-12-25 MED ORDER — OXYTOCIN BOLUS FROM INFUSION: 500.0000 mL | Freq: Once | INTRAVENOUS | Status: DC

## 2015-12-25 MED ORDER — OXYCODONE-ACETAMINOPHEN 5-325 MG PO TABS: 2.0000 | ORAL_TABLET | ORAL | Status: DC | PRN

## 2015-12-25 MED ORDER — FLEET ENEMA 7-19 GM/118ML RE ENEM: 1.0000 | ENEMA | RECTAL | Status: DC | PRN

## 2015-12-25 MED ORDER — OXYTOCIN 40 UNITS IN LACTATED RINGERS INFUSION - SIMPLE MED
2.5000 [IU]/h | INTRAVENOUS | Status: DC
  Filled 2015-12-25: qty 1000

## 2015-12-25 MED ORDER — FENTANYL CITRATE (PF) 100 MCG/2ML IJ SOLN
50.0000 ug | INTRAMUSCULAR | Status: DC | PRN
  Administered 2015-12-25 – 2015-12-26 (×2): 100 ug via INTRAVENOUS
  Filled 2015-12-25 (×2): qty 2

## 2015-12-25 MED ORDER — LACTATED RINGERS IV SOLN
INTRAVENOUS | Status: DC
  Administered 2015-12-25 – 2015-12-26 (×2): via INTRAVENOUS

## 2015-12-25 MED ORDER — LACTATED RINGERS IV SOLN
500.0000 mL | INTRAVENOUS | Status: DC | PRN
  Administered 2015-12-26: 500 mL via INTRAVENOUS

## 2015-12-25 MED ORDER — ONDANSETRON HCL 4 MG/2ML IJ SOLN: 4.0000 mg | Freq: Four times a day (QID) | INTRAMUSCULAR | Status: DC | PRN

## 2015-12-25 MED ORDER — OXYCODONE-ACETAMINOPHEN 5-325 MG PO TABS: 1.0000 | ORAL_TABLET | ORAL | Status: DC | PRN

## 2015-12-25 NOTE — MAU Note (Signed)
Pt reports she has had ctx on and off all night. Reports some bloody show. Good fetal movement.

## 2015-12-25 NOTE — MAU Note (Signed)
Pt signed AVS, computer didn't capture signature. Pt discharged home with labor precautions.

## 2015-12-25 NOTE — H&P (Signed)
[name] [name] is a 35 y.o. female G1P0000 with IUP at [redacted]w[redacted]d presenting for SROM @ 2100. Pt states she has been having irregular, every 2-5 minutes contractions, associated with none vaginal bleeding for 3 hours..  Membranes are ruptured, clear fluid, with active fetal movement.   [facility] 18 wks  Prenatal History/Complications:  Past Medical History: Past Medical History:  Diagnosis Date  . Medical history non-contributory     Past Surgical History: Past Surgical History:  Procedure Laterality Date  . NO PAST SURGERIES      Obstetrical History: OB History    Gravida Para Term Preterm AB Living   1 0 0 0 0     SAB TAB Ectopic Multiple Live Births   0 0 0 0         Social History: Social History   Social History  . Marital status: Married    Spouse name: N/A  . Number of children: N/A  . Years of education: N/A   Social History Main Topics  . Smoking status: Never Smoker  . Smokeless tobacco: Never Used  . Alcohol use No  . Drug use: No  . Sexual activity: Not on file   Other Topics Concern  . Not on file   Social History Narrative  . No narrative on file    Family History: No family history on file.  Allergies: No Known Allergies  Prescriptions Prior to Admission  Medication Sig Dispense Refill Last Dose  . acetaminophen (TYLENOL) 500 MG tablet Take 1,000 mg by mouth every 6 (six) hours as needed for moderate pain.   12/25/2015 at Unknown time  . diphenhydrAMINE (BENADRYL) 25 MG tablet Take 25 mg by mouth every 6 (six) hours as needed for allergies or sleep.   12/25/2015 at Unknown time  . Iron-FA-B Cmp-C-Biot-Probiotic (FUSION PLUS) CAPS Take 1 tablet by mouth daily. 30 capsule 12 Past Week at Unknown time  . Prenat-FePoly-Metf-FA-DHA-DSS (VITAFOL FE+) 90-1-200 & 50 MG CPPK Take 2 tablets by mouth daily. 60 each 12 Past Week at Unknown time  . ondansetron (ZOFRAN) 8 MG tablet Take 1 tablet (8 mg total) by mouth every 8 (eight) hours as needed for  nausea or vomiting. (Patient not taking: Reported on 12/25/2015) 40 tablet 2 Not Taking at Unknown time     Prenatal Transfer Tool  Maternal Diabetes: No Genetic Screening: Normal Maternal Ultrasounds/Referrals: Normal Fetal Ultrasounds or other Referrals:  None Maternal Substance Abuse:  No Significant Maternal Medications:  None Significant Maternal Lab Results: GBS unknown     Review of Systems   Constitutional: Negative for fever and chills Eyes: Negative for visual disturbances Respiratory: Negative for shortness of breath, dyspnea Cardiovascular: Negative for chest pain or palpitations  Gastrointestinal: Negative for vomiting, diarrhea and constipation.  POSITIVE for abdominal pain (contractions) Genitourinary: Negative for dysuria and urgency Musculoskeletal: Negative for back pain, joint pain, myalgias  Neurological: Negative for dizziness and headaches      Last menstrual period 03/24/2015. General appearance: alert, cooperative and no distress Lungs: clear to auscultation bilaterally Heart: regular rate and rhythm Abdomen: soft, non-tender; bowel sounds normal Pelvic: + fern, clear fluid Extremities: Homans sign is negative, no sign of DVT DTR's 2+ Presentation: cephalic Fetal monitoring  Baseline: 150 bpm, Variability: Good {> 6 bpm), Accelerations: Reactive and Decelerations: Absent Uterine activity  None Dilation: 1.5 Effacement (%): 10, 20 Station: -2 Exam by:: Ginnie Smart RN    Clinic  [facility] Prenatal Labs  Dating  LMP Blood type:  A/Positive/-- (05/09 1702)   Genetic Screen 1 Screen:    AFP:     Quad:     NIPS: Maternity21: normal 07/29/15 Antibody:Negative (05/09 1702)  Anatomic US  Normal @MFM  for AMA  Female fetus Rubella: 6.93 (05/09 1702)  GTT Early:               Third trimester: 73/152/77 RPR: Non Reactive (05/09 1702)   Flu vaccine  HBsAg: Negative (05/09 1702)   TDaP vaccine                                               Rhogam: N/A HIV:  Non Reactive (05/09 1702)   Baby Food      Breast                                          GBS: (For PCN allergy, check sensitivities)  Contraception  Pap: 07/30/15: negative  Circumcision  N/A   Pediatrician    Support Person  Spouse       Prenatal labs: ABO, Rh: A/Positive/-- (05/09 1702) Antibody: Negative (05/09 1702) Rubella: !Error! RPR: Non Reactive (07/20 1045)  HBsAg: Negative (05/09 1702)  HIV: Non Reactive (07/20 1045)  GBS:   not in EPIC, culture obtained for PEDS   No results found for this or any previous visit (from the past 24 hour(s)).  Assessment: [name] [name] is a 35 y.o. G1P0000 with an IUP at 9112w3d presenting for PROM Foley placed  Plan: #Labor: IOL #Pain:  Per request #FWB Cat 1  CRESENZO-DISHMAN,Dean Goldner 12/25/2015, 10:10 PM

## 2015-12-26 ENCOUNTER — Encounter (HOSPITAL_COMMUNITY): Payer: Self-pay | Admitting: *Deleted

## 2015-12-26 ENCOUNTER — Encounter (HOSPITAL_COMMUNITY)
Admission: AD | Disposition: A | Discharge status: Home / Self Care | Payer: Self-pay | Source: Ambulatory Visit | Attending: Obstetrics and Gynecology

## 2015-12-26 ENCOUNTER — Inpatient Hospital Stay (HOSPITAL_COMMUNITY): Payer: Medicaid Other | Admitting: Anesthesiology

## 2015-12-26 DIAGNOSIS — Z3A39 39 weeks gestation of pregnancy: Secondary | ICD-10-CM

## 2015-12-26 DIAGNOSIS — O4202 Full-term premature rupture of membranes, onset of labor within 24 hours of rupture: Secondary | ICD-10-CM

## 2015-12-26 LAB — ABO/RH: ABO/RH(D): A POS

## 2015-12-26 LAB — CBC
HEMATOCRIT: 23.9 % — AB (ref 36.0–46.0)
HEMOGLOBIN: 7.5 g/dL — AB (ref 12.0–15.0)
MCH: 24.4 pg — AB (ref 26.0–34.0)
MCHC: 31.4 g/dL (ref 30.0–36.0)
MCV: 77.9 fL — ABNORMAL LOW (ref 78.0–100.0)
Platelets: 194 10*3/uL (ref 150–400)
RBC: 3.07 MIL/uL — AB (ref 3.87–5.11)
RDW: 17.2 % — ABNORMAL HIGH (ref 11.5–15.5)
WBC: 25 10*3/uL — ABNORMAL HIGH (ref 4.0–10.5)

## 2015-12-26 LAB — PREPARE RBC (CROSSMATCH)

## 2015-12-26 LAB — RPR: RPR Ser Ql: NONREACTIVE

## 2015-12-26 LAB — GROUP B STREP BY PCR: GROUP B STREP BY PCR: POSITIVE — AB

## 2015-12-26 SURGERY — Surgical Case
Anesthesia: Epidural

## 2015-12-26 MED ORDER — PHENYLEPHRINE 40 MCG/ML (10ML) SYRINGE FOR IV PUSH (FOR BLOOD PRESSURE SUPPORT)
80.0000 ug | PREFILLED_SYRINGE | INTRAVENOUS | Status: AC | PRN
Start: 2015-12-26 — End: 2015-12-26
  Administered 2015-12-26 (×2): 120 ug via INTRAVENOUS
  Administered 2015-12-26: 80 ug via INTRAVENOUS
  Administered 2015-12-26: 120 ug via INTRAVENOUS
  Administered 2015-12-26: 80 ug via INTRAVENOUS
  Administered 2015-12-26: 120 ug via INTRAVENOUS
  Administered 2015-12-26 (×2): 80 ug via INTRAVENOUS
  Administered 2015-12-26: 120 ug via INTRAVENOUS
  Administered 2015-12-26: 80 ug via INTRAVENOUS
  Filled 2015-12-26: qty 10

## 2015-12-26 MED ORDER — SODIUM CHLORIDE 0.9 % IR SOLN
Status: DC | PRN
  Administered 2015-12-26: 1000 mL

## 2015-12-26 MED ORDER — TERBUTALINE SULFATE 1 MG/ML IJ SOLN
0.2500 mg | Freq: Once | INTRAMUSCULAR | Status: DC | PRN
Start: 2015-12-26 — End: 2015-12-26

## 2015-12-26 MED ORDER — NALBUPHINE HCL 10 MG/ML IJ SOLN: 5.0000 mg | Freq: Once | INTRAMUSCULAR | Status: DC | PRN

## 2015-12-26 MED ORDER — LACTATED RINGERS IV SOLN: INTRAVENOUS | Status: DC

## 2015-12-26 MED ORDER — DEXTROSE 5 % IV SOLN
5.0000 10*6.[IU] | Freq: Once | INTRAVENOUS | Status: AC
  Administered 2015-12-26: 5 10*6.[IU] via INTRAVENOUS
  Filled 2015-12-26: qty 5

## 2015-12-26 MED ORDER — DIPHENHYDRAMINE HCL 50 MG/ML IJ SOLN: 12.5000 mg | INTRAMUSCULAR | Status: DC | PRN

## 2015-12-26 MED ORDER — MENTHOL 3 MG MT LOZG: 1.0000 | LOZENGE | OROMUCOSAL | Status: DC | PRN

## 2015-12-26 MED ORDER — NALOXONE HCL 0.4 MG/ML IJ SOLN: 0.4000 mg | INTRAMUSCULAR | Status: DC | PRN

## 2015-12-26 MED ORDER — LIDOCAINE HCL (PF) 1 % IJ SOLN
INTRAMUSCULAR | Status: DC | PRN
  Administered 2015-12-26: 3 mL
  Administered 2015-12-26: 5 mL via EPIDURAL

## 2015-12-26 MED ORDER — PHENYLEPHRINE 40 MCG/ML (10ML) SYRINGE FOR IV PUSH (FOR BLOOD PRESSURE SUPPORT): 80.0000 ug | PREFILLED_SYRINGE | INTRAVENOUS | Status: DC | PRN

## 2015-12-26 MED ORDER — ZOLPIDEM TARTRATE 5 MG PO TABS: 5.0000 mg | ORAL_TABLET | Freq: Every evening | ORAL | Status: DC | PRN

## 2015-12-26 MED ORDER — PRENATAL MULTIVITAMIN CH
1.0000 | ORAL_TABLET | Freq: Every day | ORAL | Status: DC
  Administered 2015-12-27 – 2015-12-28 (×2): 1 via ORAL
  Filled 2015-12-26 (×2): qty 1

## 2015-12-26 MED ORDER — BUPIVACAINE HCL (PF) 0.5 % IJ SOLN
INTRAMUSCULAR | Status: AC
  Filled 2015-12-26: qty 30

## 2015-12-26 MED ORDER — ONDANSETRON HCL 4 MG/2ML IJ SOLN
4.0000 mg | Freq: Three times a day (TID) | INTRAMUSCULAR | Status: DC | PRN
Start: 2015-12-26 — End: 2015-12-28

## 2015-12-26 MED ORDER — IBUPROFEN 600 MG PO TABS: 600.0000 mg | ORAL_TABLET | Freq: Four times a day (QID) | ORAL | Status: DC | PRN

## 2015-12-26 MED ORDER — CEFAZOLIN SODIUM-DEXTROSE 2-3 GM-% IV SOLR
INTRAVENOUS | Status: DC | PRN
  Administered 2015-12-26: 2 g via INTRAVENOUS

## 2015-12-26 MED ORDER — HYDROMORPHONE HCL 1 MG/ML IJ SOLN: 0.2500 mg | INTRAMUSCULAR | Status: DC | PRN

## 2015-12-26 MED ORDER — NALBUPHINE HCL 10 MG/ML IJ SOLN: 5.0000 mg | INTRAMUSCULAR | Status: DC | PRN

## 2015-12-26 MED ORDER — TETANUS-DIPHTH-ACELL PERTUSSIS 5-2.5-18.5 LF-MCG/0.5 IM SUSP
0.5000 mL | Freq: Once | INTRAMUSCULAR | Status: AC
  Administered 2015-12-28: 0.5 mL via INTRAMUSCULAR

## 2015-12-26 MED ORDER — OXYTOCIN 40 UNITS IN LACTATED RINGERS INFUSION - SIMPLE MED
1.0000 m[IU]/min | INTRAVENOUS | Status: DC
  Administered 2015-12-26: 2 m[IU]/min via INTRAVENOUS

## 2015-12-26 MED ORDER — PENICILLIN G POTASSIUM 5000000 UNITS IJ SOLR
2.5000 10*6.[IU] | INTRAVENOUS | Status: DC
  Filled 2015-12-26 (×3): qty 2.5

## 2015-12-26 MED ORDER — SIMETHICONE 80 MG PO CHEW
80.0000 mg | CHEWABLE_TABLET | ORAL | Status: DC
  Administered 2015-12-27: 80 mg via ORAL
  Filled 2015-12-26: qty 1

## 2015-12-26 MED ORDER — PROMETHAZINE HCL 25 MG/ML IJ SOLN: 6.2500 mg | INTRAMUSCULAR | Status: DC | PRN

## 2015-12-26 MED ORDER — ACETAMINOPHEN 325 MG PO TABS
650.0000 mg | ORAL_TABLET | ORAL | Status: DC | PRN
  Administered 2015-12-27 – 2015-12-28 (×2): 650 mg via ORAL
  Filled 2015-12-26 (×2): qty 2

## 2015-12-26 MED ORDER — KETOROLAC TROMETHAMINE 30 MG/ML IJ SOLN: 30.0000 mg | Freq: Four times a day (QID) | INTRAMUSCULAR | Status: DC | PRN

## 2015-12-26 MED ORDER — MEPERIDINE HCL 25 MG/ML IJ SOLN: 6.2500 mg | INTRAMUSCULAR | Status: DC | PRN

## 2015-12-26 MED ORDER — CEFAZOLIN SODIUM-DEXTROSE 2-4 GM/100ML-% IV SOLN: 2.0000 g | Freq: Once | INTRAVENOUS | Status: DC

## 2015-12-26 MED ORDER — ONDANSETRON HCL 4 MG/2ML IJ SOLN
INTRAMUSCULAR | Status: DC | PRN
  Administered 2015-12-26: 4 mg via INTRAVENOUS

## 2015-12-26 MED ORDER — SODIUM BICARBONATE 8.4 % IV SOLN
INTRAVENOUS | Status: DC | PRN
  Administered 2015-12-26: 5 mL via EPIDURAL

## 2015-12-26 MED ORDER — LACTATED RINGERS IV SOLN
INTRAVENOUS | Status: DC | PRN
  Administered 2015-12-26: 14:00:00 via INTRAVENOUS

## 2015-12-26 MED ORDER — ACETAMINOPHEN 500 MG PO TABS
1000.0000 mg | ORAL_TABLET | Freq: Four times a day (QID) | ORAL | Status: AC
  Administered 2015-12-27 (×3): 1000 mg via ORAL
  Filled 2015-12-26 (×3): qty 2

## 2015-12-26 MED ORDER — COCONUT OIL OIL: 1.0000 "application " | TOPICAL_OIL | Status: DC | PRN

## 2015-12-26 MED ORDER — PHENYLEPHRINE 40 MCG/ML (10ML) SYRINGE FOR IV PUSH (FOR BLOOD PRESSURE SUPPORT)
PREFILLED_SYRINGE | INTRAVENOUS | Status: AC
  Filled 2015-12-26: qty 30

## 2015-12-26 MED ORDER — DIPHENHYDRAMINE HCL 25 MG PO CAPS
25.0000 mg | ORAL_CAPSULE | Freq: Four times a day (QID) | ORAL | Status: DC | PRN
Start: 2015-12-26 — End: 2015-12-28

## 2015-12-26 MED ORDER — CEFAZOLIN (ANCEF) 1 G IV SOLR: 2.0000 g | INTRAVENOUS | Status: DC

## 2015-12-26 MED ORDER — LACTATED RINGERS IV SOLN: 500.0000 mL | Freq: Once | INTRAVENOUS | Status: DC

## 2015-12-26 MED ORDER — SODIUM CHLORIDE 0.9 % IV SOLN: Freq: Once | INTRAVENOUS | Status: DC

## 2015-12-26 MED ORDER — SIMETHICONE 80 MG PO CHEW: 80.0000 mg | CHEWABLE_TABLET | ORAL | Status: DC | PRN

## 2015-12-26 MED ORDER — FENTANYL 2.5 MCG/ML BUPIVACAINE 1/10 % EPIDURAL INFUSION (WH - ANES)
14.0000 mL/h | INTRAMUSCULAR | Status: DC | PRN
  Administered 2015-12-26: 14 mL/h via EPIDURAL
  Administered 2015-12-26: 12 mL/h via EPIDURAL
  Filled 2015-12-26 (×2): qty 125

## 2015-12-26 MED ORDER — PHENYLEPHRINE 40 MCG/ML (10ML) SYRINGE FOR IV PUSH (FOR BLOOD PRESSURE SUPPORT)
PREFILLED_SYRINGE | INTRAVENOUS | Status: AC
Start: 2015-12-26 — End: 2015-12-26
  Filled 2015-12-26: qty 20

## 2015-12-26 MED ORDER — DIPHENHYDRAMINE HCL 25 MG PO CAPS: 25.0000 mg | ORAL_CAPSULE | ORAL | Status: DC | PRN

## 2015-12-26 MED ORDER — MORPHINE SULFATE (PF) 0.5 MG/ML IJ SOLN
INTRAMUSCULAR | Status: DC | PRN
  Administered 2015-12-26: 2 mg via EPIDURAL

## 2015-12-26 MED ORDER — EPHEDRINE 5 MG/ML INJ: 10.0000 mg | INTRAVENOUS | Status: DC | PRN

## 2015-12-26 MED ORDER — SIMETHICONE 80 MG PO CHEW
80.0000 mg | CHEWABLE_TABLET | Freq: Three times a day (TID) | ORAL | Status: DC
  Administered 2015-12-27 – 2015-12-28 (×5): 80 mg via ORAL
  Filled 2015-12-26 (×6): qty 1

## 2015-12-26 MED ORDER — ONDANSETRON HCL 4 MG/2ML IJ SOLN
INTRAMUSCULAR | Status: AC
  Filled 2015-12-26: qty 2

## 2015-12-26 MED ORDER — OXYTOCIN 10 UNIT/ML IJ SOLN
INTRAMUSCULAR | Status: AC
  Filled 2015-12-26: qty 5

## 2015-12-26 MED ORDER — DIBUCAINE 1 % RE OINT: 1.0000 "application " | TOPICAL_OINTMENT | RECTAL | Status: DC | PRN

## 2015-12-26 MED ORDER — LACTATED RINGERS IV SOLN
INTRAVENOUS | Status: DC | PRN
  Administered 2015-12-26 (×3): via INTRAVENOUS

## 2015-12-26 MED ORDER — SCOPOLAMINE 1 MG/3DAYS TD PT72
1.0000 | MEDICATED_PATCH | Freq: Once | TRANSDERMAL | Status: DC
Start: 2015-12-26 — End: 2015-12-28
  Filled 2015-12-26: qty 1

## 2015-12-26 MED ORDER — OXYTOCIN 40 UNITS IN LACTATED RINGERS INFUSION - SIMPLE MED: 2.5000 [IU]/h | INTRAVENOUS | Status: AC

## 2015-12-26 MED ORDER — MORPHINE SULFATE (PF) 0.5 MG/ML IJ SOLN
INTRAMUSCULAR | Status: AC
  Filled 2015-12-26: qty 10

## 2015-12-26 MED ORDER — SODIUM CHLORIDE 0.9% FLUSH: 3.0000 mL | INTRAVENOUS | Status: DC | PRN

## 2015-12-26 MED ORDER — IBUPROFEN 600 MG PO TABS
600.0000 mg | ORAL_TABLET | Freq: Four times a day (QID) | ORAL | Status: DC
  Administered 2015-12-27 – 2015-12-28 (×7): 600 mg via ORAL
  Filled 2015-12-26 (×7): qty 1

## 2015-12-26 MED ORDER — WITCH HAZEL-GLYCERIN EX PADS: 1.0000 "application " | MEDICATED_PAD | CUTANEOUS | Status: DC | PRN

## 2015-12-26 MED ORDER — SENNOSIDES-DOCUSATE SODIUM 8.6-50 MG PO TABS
2.0000 | ORAL_TABLET | ORAL | Status: DC
  Administered 2015-12-27: 2 via ORAL
  Filled 2015-12-26 (×2): qty 2

## 2015-12-26 MED ORDER — NALOXONE HCL 2 MG/2ML IJ SOSY
1.0000 ug/kg/h | PREFILLED_SYRINGE | INTRAVENOUS | Status: DC | PRN
  Filled 2015-12-26: qty 2

## 2015-12-26 MED ORDER — OXYTOCIN 10 UNIT/ML IJ SOLN
INTRAMUSCULAR | Status: DC | PRN
  Administered 2015-12-26: 40 [IU] via INTRAVENOUS

## 2015-12-26 SURGICAL SUPPLY — 40 items
BENZOIN TINCTURE PRP APPL 2/3 (GAUZE/BANDAGES/DRESSINGS) ×3 IMPLANT
CHLORAPREP W/TINT 26ML (MISCELLANEOUS) ×3 IMPLANT
CLAMP CORD UMBIL (MISCELLANEOUS) IMPLANT
CLOSURE STERI STRIP 1/2 X4 (GAUZE/BANDAGES/DRESSINGS) ×3 IMPLANT
CLOTH BEACON ORANGE TIMEOUT ST (SAFETY) ×3 IMPLANT
DRAPE C SECTION CLR SCREEN (DRAPES) ×3 IMPLANT
DRSG OPSITE POSTOP 4X10 (GAUZE/BANDAGES/DRESSINGS) ×3 IMPLANT
ELECT REM PT RETURN 9FT ADLT (ELECTROSURGICAL) ×3
ELECTRODE REM PT RTRN 9FT ADLT (ELECTROSURGICAL) ×1 IMPLANT
EXTRACTOR VACUUM M CUP 4 TUBE (SUCTIONS) IMPLANT
EXTRACTOR VACUUM M CUP 4' TUBE (SUCTIONS)
GLOVE BIO SURGEON STRL SZ7.5 (GLOVE) ×3 IMPLANT
GLOVE BIOGEL PI IND STRL 7.0 (GLOVE) ×1 IMPLANT
GLOVE BIOGEL PI INDICATOR 7.0 (GLOVE) ×2
GOWN STRL REUS W/TWL 2XL LVL3 (GOWN DISPOSABLE) ×3 IMPLANT
GOWN STRL REUS W/TWL LRG LVL3 (GOWN DISPOSABLE) ×6 IMPLANT
KIT ABG SYR 3ML LUER SLIP (SYRINGE) IMPLANT
NEEDLE HYPO 22GX1.5 SAFETY (NEEDLE) ×3 IMPLANT
NEEDLE HYPO 25X5/8 SAFETYGLIDE (NEEDLE) IMPLANT
NS IRRIG 1000ML POUR BTL (IV SOLUTION) ×3 IMPLANT
PACK C SECTION WH (CUSTOM PROCEDURE TRAY) ×3 IMPLANT
PAD OB MATERNITY 4.3X12.25 (PERSONAL CARE ITEMS) ×3 IMPLANT
PENCIL SMOKE EVAC W/HOLSTER (ELECTROSURGICAL) ×3 IMPLANT
RTRCTR C-SECT PINK 25CM LRG (MISCELLANEOUS) ×3 IMPLANT
STRIP CLOSURE SKIN 1/2X4 (GAUZE/BANDAGES/DRESSINGS) ×2 IMPLANT
SUT CHROMIC 1 CTX 36 (SUTURE) ×6 IMPLANT
SUT VIC AB 1 CT1 27 (SUTURE) ×4
SUT VIC AB 1 CT1 27XBRD ANTBC (SUTURE) ×2 IMPLANT
SUT VIC AB 2-0 CT1 (SUTURE) ×3 IMPLANT
SUT VIC AB 2-0 CT1 27 (SUTURE) ×2
SUT VIC AB 2-0 CT1 TAPERPNT 27 (SUTURE) ×1 IMPLANT
SUT VIC AB 3-0 CT1 27 (SUTURE) ×4
SUT VIC AB 3-0 CT1 TAPERPNT 27 (SUTURE) ×2 IMPLANT
SUT VIC AB 3-0 SH 27 (SUTURE)
SUT VIC AB 3-0 SH 27X BRD (SUTURE) IMPLANT
SUT VIC AB 4-0 KS 27 (SUTURE) ×3 IMPLANT
SYR BULB IRRIGATION 50ML (SYRINGE) ×3 IMPLANT
SYR CONTROL 10ML LL (SYRINGE) ×3 IMPLANT
TOWEL OR 17X24 6PK STRL BLUE (TOWEL DISPOSABLE) ×3 IMPLANT
TRAY FOLEY CATH SILVER 14FR (SET/KITS/TRAYS/PACK) ×3 IMPLANT

## 2015-12-26 NOTE — Progress Notes (Signed)
CBC results and vital sign trends reported to McCauslandHatchett, MD and Alysia PennaErvin, MD. Verbal orders received to transfuse 2 units PRBC's, each unit to be given over 3 hours per Alysia PennaErvin, MD. Molli Knockkay to transfer to floor prior to completion of blood transfusion. No medication orders for transfusion given.  Craige CottaAmanda Travontae Freiberger, RNC

## 2015-12-26 NOTE — Progress Notes (Signed)
OB Attending Interrupter services used to communicate to pt and FOB CTSP due to fetal decelerations. Repeat decelerations noted to 90's Cervical exam unchanged  C section recommended to pt and husband due to fetal intolerance of labor and arrest of descent  The risks of cesarean section discussed with the patient included but were not limited to: bleeding which may require transfusion or reoperation; infection which may require antibiotics; injury to bowel, bladder, ureters or other surrounding organs; injury to the fetus; need for additional procedures including hysterectomy in the event of a life-threatening hemorrhage; placental abnormalities wth subsequent pregnancies, incisional problems, thromboembolic phenomenon and other postoperative/anesthesia complications. The patient concurred with the proposed plan, giving informed written consent for the procedure.   Anesthesia and OR aware. Preoperative prophylactic antibiotics and SCDs ordered on call to the OR.  To OR when ready.  Michael L. Alysia PennaErvin, MD

## 2015-12-26 NOTE — Op Note (Signed)
Cesarean Section Procedure Note  12/25/2015 - 12/26/2015  2:54 PM  PATIENT:  [name] [name]  35 y.o. female  PRE-OPERATIVE DIAGNOSIS:  fetal indication and arrest of descent  POST-OPERATIVE DIAGNOSIS:  fetal indication and arrest of descen  PROCEDURE:  Procedure(s): CESAREAN SECTION (N/A)  SURGEON:  Surgeon(s) and Role:    * Hermina StaggersMichael L Reeta Kuk, MD - Primary  ASSISTANTS: none   ANESTHESIA:   epidural  EBL:  Total I/O In: 3200 [I.V.:3200] Out: 1550 [Urine:350; Blood:1200]  BLOOD ADMINISTERED:none  DRAINS: none   LOCAL MEDICATIONS USED:  MARCAINE     SPECIMEN:  Source of Specimen:  placenta  DISPOSITION OF SPECIMEN:  PATHOLOGY   Procedure Details   The patient was seen in the Holding Room. The risks, benefits, complications, treatment options, and expected outcomes were discussed with the patient.  The patient concurred with the proposed plan, giving informed consent.  The site of surgery properly noted/marked. The patient was taken to Operating Room # 9, identified as [name] [name] and the procedure verified as C-Section Delivery. A Time Out was held and the above information confirmed.  After induction of anesthesia, the patient was draped and prepped in the usual sterile manner. A Pfannenstiel incision was made and carried down through the subcutaneous tissue to the fascia. Fascial incision was made and extended transversely. The fascia was separated from the underlying rectus tissue superiorly and inferiorly. The peritoneum was identified and entered. Peritoneal incision was extended longitudinally. The utero-vesical peritoneal reflection was incised transversely and the bladder flap was bluntly freed from the lower uterine segment. A low transverse uterine incision was made and extended bluntly. Meconium stained fluid was noted. Infants head was elevated to incision and delivered without problems. Remainder of infant was delivered with assistance of fundal pressure without problems.  .  After the umbilical cord was clamped and cut cord blood was obtained for evaluation. The placenta was removed intact and appeared normal. The uterine outline, tubes and ovaries appeared normal. The uterine incision was closed with running locked sutures of # 1 Chromic. Suture of the same was used to completed a two layer closure. Hemostasis was observed.  Bladder flap was closed with 3/0 Vicryl. Uterus was placed back in its normal anatomical position. Irrigation was performed. Peritoneum and muscle were closed with 2/0 Vicryl . The fascia was then reapproximated with running sutures of Vicryl. The skin was reapproximated with Vicryl.  Instrument, sponge, and needle counts were correct prior the abdominal closure and at the conclusion of the case.   Complications:  None; patient tolerated the procedure well.  COUNTS:  YES  PLAN OF CARE: Transfer to PACU and them postpartum  PATIENT DISPOSITION:  PACU - hemodynamically stable.   Delay start of Pharmacological VTE agent (>24hrs) due to surgical blood loss or risk of bleeding: not applicable             Disposition: PACU - hemodynamically stable.         Condition: stable   Hermina StaggersMichael L Arla Boutwell, MD 12/26/2015 2:54 PM

## 2015-12-26 NOTE — Transfer of Care (Signed)
Immediate Anesthesia Transfer of Care Note  Patient: [name] [name]  Procedure(s) Performed: Procedure(s): CESAREAN SECTION (N/A)  Patient Location: PACU  Anesthesia Type:Epidural  Level of Consciousness: awake, alert  and oriented  Airway & Oxygen Therapy: Patient Spontanous Breathing  Post-op Assessment: Report given to RN and Post -op Vital signs reviewed and stable  Post vital signs: Reviewed and stable  Last Vitals:  Vitals:   12/26/15 1300 12/26/15 1330  BP: 110/76 108/81  Pulse: 89 100  Resp:    Temp: 37.2 C     Last Pain:  Vitals:   12/26/15 1310  TempSrc:   PainSc: 0-No pain         Complications: No apparent anesthesia complications

## 2015-12-26 NOTE — Progress Notes (Signed)
Alysia PennaErvin, MD notified honeycomb 25% saturated, dressing changed and pressure dressing applied. No further orders given.  Craige CottaAmanda Halbert Jesson, RNC

## 2015-12-26 NOTE — Anesthesia Procedure Notes (Signed)
Epidural Patient location during procedure: OB  Preanesthetic Checklist Completed: patient identified, site marked, surgical consent, pre-op evaluation, timeout performed, IV checked, risks and benefits discussed and monitors and equipment checked  Epidural Patient position: sitting Prep: site prepped and draped and DuraPrep Patient monitoring: continuous pulse ox and blood pressure Approach: midline Location: L3-L4 Injection technique: LOR air  Needle:  Needle type: Tuohy  Needle gauge: 17 G Needle length: 9 cm and 9 Needle insertion depth: 5 cm cm Catheter type: closed end flexible Catheter size: 19 Gauge Catheter at skin depth: 10 cm Test dose: negative  Assessment Events: blood not aspirated, injection not painful, no injection resistance, negative IV test and no paresthesia  Additional Notes Dosing of Epidural:  1st dose, through catheter ............................................. Xylocaine 30 mg  2nd dose, through catheter, after waiting 3 minutes........Xylocaine 50 mg     As each dose occurred, patient was free of IV sx; and patient exhibited no evidence of SA injection.  Patient is more comfortable after epidural dosed. Please see RN's note for documentation of vital signs,and FHR which are stable.  Patient reminded not to try to ambulate with numb legs, and that an RN must be present the 1st time she attempts to get up.      

## 2015-12-26 NOTE — Lactation Note (Signed)
This note was copied from a baby's chart. Lactation Consultation Note Initial visit at 7 hours of age.  FOB interprets montagnard for mom.  FOB has 3 older children with previous wife.  This is moms first baby.  Mom is getting blood transfusion and she is very sleepy.  Baby is asleep in crib.  Nursery Rn spoon fed baby recently.  Baby had a breast feeding and also a bottle of formula.  LC discussed with FOB if mom is feeling well she should latch baby and then spoon feed as needed. LC encouraged parents to avoid bottle feedings of formula until breast feeding is well established.  Mom has large full breast with everted nipples.  Mom is able to return demonstration of hand expression.  [facility] resources given and discussed.  Encouraged to feed with early cues on demand.  Early newborn behavior discussed.  Mom to call for assist as needed.      Patient Name: [name] [name] Today's Date: 12/26/2015 Reason for consult: Initial assessment   Maternal Data Has patient been taught Hand Expression?: Yes Does the patient have breastfeeding experience prior to this delivery?: No  Feeding Feeding Type: Breast Milk  LATCH Score/Interventions       Type of Nipple: Everted at rest and after stimulation              Lactation Tools Discussed/Used WIC Program: No   Consult Status Consult Status: Follow-up Date: 12/27/15 Follow-up type: In-patient    [name] [name], [name] [name] 12/26/2015, 9:59 PM

## 2015-12-26 NOTE — Anesthesia Postprocedure Evaluation (Signed)
Anesthesia Post Note  Patient: [name] [name]  Procedure(s) Performed: Procedure(s) (LRB): CESAREAN SECTION (N/A)  Patient location during evaluation: PACU Anesthesia Type: Epidural Level of consciousness: awake Pain management: pain level controlled Respiratory status: spontaneous breathing Cardiovascular status: stable Postop Assessment: no headache, no backache, epidural receding, patient able to bend at knees and no signs of nausea or vomiting Anesthetic complications: no     Last Vitals:  Vitals:   12/26/15 1540 12/26/15 1545  BP: (!) 89/55 (!) 89/56  Pulse: 80 81  Resp: (!) 21 (!) 23  Temp:      Last Pain:  Vitals:   12/26/15 1529  TempSrc: Oral  PainSc:    Pain Goal:                 [name] [name],[name] [name]

## 2015-12-26 NOTE — Anesthesia Pain Management Evaluation Note (Signed)
  CRNA Pain Management Visit Note  Patient: [name] [name], 35 y.o., female  "Hello [facility] am a [facility] of the [facility]. We have an anesthesia team available at all times to provide care throughout the hospital, including epidural management and anesthesia for C-section. I don't know your plan for the delivery whether it a natural birth, water birth, IV sedation, nitrous supplementation, doula or epidural, but we want to meet your pain goals."   1.Was your pain managed to your expectations on prior hospitalizations?     2.What is your expectation for pain management during this hospitalization?     3.How can we help you reach that goal?   Record the patient's initial score and the patient's pain goal.   Pain: 5  Pain Goal: 5 [facility] wants you to be able to say your pain was always managed very well.  Jameika Kinn Hristova 12/26/2015

## 2015-12-26 NOTE — Anesthesia Preprocedure Evaluation (Addendum)
Anesthesia Evaluation  Patient identified by MRN, date of birth, ID band Patient awake    Reviewed: Allergy & Precautions, H&P , Patient's Chart, lab work & pertinent test results  Airway Mallampati: II  TM Distance: >3 FB Neck ROM: full    Dental  (+) Teeth Intact   Pulmonary    breath sounds clear to auscultation       Cardiovascular  Rhythm:regular Rate:Normal     Neuro/Psych    GI/Hepatic   Endo/Other    Renal/GU      Musculoskeletal   Abdominal   Peds  Hematology   Anesthesia Other Findings       Reproductive/Obstetrics (+) Pregnancy                             Anesthesia Physical Anesthesia Plan  ASA: II  Anesthesia Plan: Epidural   Post-op Pain Management:    Induction:   Airway Management Planned:   Additional Equipment:   Intra-op Plan:   Post-operative Plan:   Informed Consent: I have reviewed the patients History and Physical, chart, labs and discussed the procedure including the risks, benefits and alternatives for the proposed anesthesia with the patient or authorized representative who has indicated his/her understanding and acceptance.   Dental Advisory Given  Plan Discussed with: CRNA and Surgeon  Anesthesia Plan Comments: (Labs checked- platelets confirmed with RN in room. Fetal heart tracing, per RN, reported to be stable enough for sitting procedure. Discussed epidural, and patient consents to the procedure:  included risk of possible headache,backache, failed block, allergic reaction, and nerve injury. This patient was asked if she had any questions or concerns before the procedure started. For C/S with labor epidural.)       Anesthesia Quick Evaluation

## 2015-12-26 NOTE — Lactation Note (Signed)
This note was copied from a baby's chart. Attempted to consult with mom in PACU. Per RN mom is getting ready to receive a transfusion and not available. Infant asleep in crib and has been fed formula. Follow up at a later time.

## 2015-12-26 NOTE — Progress Notes (Signed)
Night shift RN sent GBS as culture, instead of STAT PCR. [facility] well as [facility] unable to locate previous GBS sent from the office. Dr Omer JackMumaw notified; Re-collect of STAT PCR ordered. [name] [name] [name] [name], [place]RN 12/26/2015 9:30 AM

## 2015-12-26 NOTE — Progress Notes (Signed)
   [name] [name] is a 35 y.o. G1P0000 at 6224w4d  admitted for induction of labor due to Spontaneous rupture of BOW.  Subjective:  Comfortable w/epidural Objective: Vitals:   12/26/15 0516 12/26/15 0529 12/26/15 0559 12/26/15 0629  BP:  (!) 94/56 96/61 93/76   Pulse:  94 85 98  Resp:  18 18 18   Temp: 98.4 F (36.9 C)     TempSrc: Oral     SpO2:      Weight:      Height:       No intake/output data recorded.  FHT:  FHR: 150 bpm, variability: moderate,  accelerations:  Present,  decelerations:  Absent UC:   none SVE:   Dilation: 4 Effacement (%): 50, 60 Station: +1, 0 Exam by:: emily r rn Pitocin @ 2-=4 mu/min Foley fell out around 0500 Labs: Lab Results  Component Value Date   WBC 15.2 (H) 12/25/2015   HGB 10.4 (L) 12/25/2015   HCT 33.5 (L) 12/25/2015   MCV 77.7 (L) 12/25/2015   PLT 246 12/25/2015    Assessment / Plan: Induction of labor due to PROM,  progressing well on pitocin  Labor: Progressing normally Fetal Wellbeing:  Category I Pain Control:  Epidural Anticipated MOD:  NSVD  [name] [name] [name] [name] 12/26/2015, 6:44 AM

## 2015-12-27 DIAGNOSIS — Z3A39 39 weeks gestation of pregnancy: Secondary | ICD-10-CM

## 2015-12-27 DIAGNOSIS — O4202 Full-term premature rupture of membranes, onset of labor within 24 hours of rupture: Secondary | ICD-10-CM

## 2015-12-27 LAB — CBC
HCT: 26.8 % — ABNORMAL LOW (ref 36.0–46.0)
HEMOGLOBIN: 9.1 g/dL — AB (ref 12.0–15.0)
MCH: 26.3 pg (ref 26.0–34.0)
MCHC: 34 g/dL (ref 30.0–36.0)
MCV: 77.5 fL — ABNORMAL LOW (ref 78.0–100.0)
Platelets: 154 10*3/uL (ref 150–400)
RBC: 3.46 MIL/uL — AB (ref 3.87–5.11)
RDW: 16 % — ABNORMAL HIGH (ref 11.5–15.5)
WBC: 16.2 10*3/uL — ABNORMAL HIGH (ref 4.0–10.5)

## 2015-12-27 LAB — TYPE AND SCREEN
ABO/RH(D): A POS
ANTIBODY SCREEN: NEGATIVE
Unit division: 0
Unit division: 0

## 2015-12-27 NOTE — Progress Notes (Signed)
Subjective: Postpartum Day #1 S/P: Cesarean Delivery Patient reports tolerating PO, + flatus and no problems voiding.  Has been out of bed.   Objective: Vital signs in last 24 hours: Temp:  [97.6 F (36.4 C)-99.8 F (37.7 C)] 97.6 F (36.4 C) (10/07 1113) Pulse Rate:  [72-109] 74 (10/07 1113) Resp:  [18-25] 20 (10/07 1113) BP: (77-145)/(44-117) 90/58 (10/07 1113) SpO2:  [86 %-100 %] 95 % (10/07 1113)  Physical Exam:  General: alert, cooperative and no distress Lochia: appropriate Uterine Fundus: firm Incision: honeycomb bandage in place. Dried blood on right side of honeycomb but otherwise clean, dry and intact DVT Evaluation: No evidence of DVT seen on physical exam.   Recent Labs  12/26/15 1529 12/27/15 0555  HGB 7.5* 9.1*  HCT 23.9* 26.8*    Assessment/Plan: Status post Cesarean section. Doing well postoperatively.  Continue current care.  Charlesetta GaribaldiKathryn Lorraine Joie Reamer CNM 12/27/2015, 1:24 PM

## 2015-12-27 NOTE — Anesthesia Postprocedure Evaluation (Signed)
Anesthesia Post Note  Patient: [name] [name]  Procedure(s) Performed: Procedure(s) (LRB): CESAREAN SECTION (N/A)  Patient location during evaluation: Mother Baby Anesthesia Type: Epidural Level of consciousness: awake and alert and oriented Pain management: pain level controlled Vital Signs Assessment: post-procedure vital signs reviewed and stable Respiratory status: spontaneous breathing and nonlabored ventilation Cardiovascular status: stable Postop Assessment: no headache, no backache, patient able to bend at knees, no signs of nausea or vomiting and adequate PO intake Anesthetic complications: no     Last Vitals:  Vitals:   12/27/15 0351 12/27/15 0745  BP: (!) 102/54 (!) 96/56  Pulse: 87 72  Resp: 20 20  Temp:  36.6 C    Last Pain:  Vitals:   12/27/15 0745  TempSrc: Oral  PainSc:    Pain Goal:                 Madison Hickman

## 2015-12-27 NOTE — Addendum Note (Signed)
Addendum  created 12/27/15 0749 by Shanon PayorSuzanne M Chirsty Armistead, CRNA   Sign clinical note

## 2015-12-27 NOTE — Lactation Note (Signed)
This note was copied from a baby's chart. Lactation Consultation Note  Patient Name: [name] [name] Today's Date: 12/27/2015 Reason for consult: Follow-up assessment  Baby 27 hours old. Offered interpretive services, but mom declined and FOB interpreted. Mom reports that the baby just finished nursing, but mom does not feel that she has anything flowing for the baby. Mom states that she offered the baby formula earlier, but the baby spit it out. Assisted mom with positioning and hand expression with colostrum present bilaterally. Discussed supply and demand and progression of milk coming to volume. Assisted mom to latch baby to right breast in football position and baby latched deeply and suckled rhythmically with some swallows noted. Enc mom to nurse baby with cues and to alternate breast at the start of each feeding.   Mom asked when her IV could be removed and complained of tight belly--but denies pain. Assured mom this LC would notify her bedside RN, Tresa EndoKelly, which was done.    Maternal Data    Feeding Feeding Type: Breast Fed Length of feed:  (LC assessed first 10 minutes of BF. )  LATCH Score/Interventions Latch: Grasps breast easily, tongue down, lips flanged, rhythmical sucking.  Audible Swallowing: A few with stimulation Intervention(s): Skin to skin;Hand expression  Type of Nipple: Everted at rest and after stimulation  Comfort (Breast/Nipple): Soft / non-tender     Hold (Positioning): Assistance needed to correctly position infant at breast and maintain latch. Intervention(s): Breastfeeding basics reviewed;Support Pillows;Position options;Skin to skin  LATCH Score: 8  Lactation Tools Discussed/Used     Consult Status Consult Status: Follow-up Date: 12/28/15 Follow-up type: In-patient    Sherlyn HayJennifer D Elajah Kunsman 12/27/2015, 5:45 PM

## 2015-12-27 NOTE — Progress Notes (Signed)
Per report from day shift RN , per Doctor do not do CBC lab after blood is finished. Do CBC lab in the am rounds.

## 2015-12-28 DIAGNOSIS — Z3A39 39 weeks gestation of pregnancy: Secondary | ICD-10-CM

## 2015-12-28 DIAGNOSIS — O4202 Full-term premature rupture of membranes, onset of labor within 24 hours of rupture: Secondary | ICD-10-CM

## 2015-12-28 LAB — CULTURE, BETA STREP (GROUP B ONLY)

## 2015-12-28 MED ORDER — OXYCODONE-ACETAMINOPHEN 5-325 MG PO TABS: 1.0000 | ORAL_TABLET | ORAL | 0 refills | Status: AC | PRN

## 2015-12-28 MED ORDER — INFLUENZA VAC SPLIT QUAD 0.5 ML IM SUSY
0.5000 mL | PREFILLED_SYRINGE | Freq: Once | INTRAMUSCULAR | Status: AC
  Administered 2015-12-28: 0.5 mL via INTRAMUSCULAR

## 2015-12-28 MED ORDER — IBUPROFEN 600 MG PO TABS: 600.0000 mg | ORAL_TABLET | Freq: Four times a day (QID) | ORAL | 0 refills | Status: AC

## 2015-12-28 MED ORDER — OXYCODONE-ACETAMINOPHEN 5-325 MG PO TABS
1.0000 | ORAL_TABLET | ORAL | Status: DC | PRN
  Administered 2015-12-28: 1 via ORAL
  Filled 2015-12-28: qty 1

## 2015-12-28 NOTE — Progress Notes (Signed)
Patient ID: [name] [name], female   DOB: 01/30/1981, 35 y.o.   MRN: [id number] Post OP day 2  Subjective:  [name] [name] is a 35 y.o. G1P1001 5359w4d s/p PLTCS.  No acute events overnight.  Pt denies problems with ambulating, voiding or po intake.  Endorses incisional pain when ambulating but is well controlled.  She has had flatus. She has had bowel movement.  Lochia minimal.  Plan for birth control is oral progesterone-only contraceptive.  Method of Feeding: breast; endorses some difficulty with feeding.   Denies dizziness, chest pain, shortness of breath Endorses abdominal pain  Objective: BP 90/63 (BP Location: Left Arm)   Pulse 80   Temp 98 F (36.7 C) (Oral)   Resp 20   Ht 5\' 2"  (1.575 m)   Wt 71.7 kg (158 lb)   LMP 03/24/2015 (Approximate)   SpO2 95%   Breastfeeding? Unknown   BMI 28.90 kg/m   Physical Exam:  General: alert, cooperative and no distress Lochia:normal flow Chest: CTAB Heart: RRR no m/r/g Abdomen: distended, tender throughout,  Uterine Fundus: firm, at umbilicus DVT Evaluation: No evidence of DVT seen on physical exam. Extremities: minimal edema   Recent Labs  12/26/15 1529 12/27/15 0555  HGB 7.5* 9.1*  HCT 23.9* 26.8*    Assessment/Plan:  ASSESSMENT: [name] [name] is a 35 y.o. G1P1001 2559w4d post op day 2 s/p PLTCS  With some abdominal tenderness and distention.   Plan for discharge tomorrow, Breastfeeding, Lactation consult and Contraception progesterone only pills   LOS: 3 days   Harvin HazelChristina Rizk 12/28/2015, 7:52 AM

## 2015-12-28 NOTE — Lactation Note (Signed)
This note was copied from a baby's chart. Lactation Consultation Note  FOB interpreting.  Undressed baby for feeding. Mother expressed drops of colostrum w/ FOB's assistance. Baby latched in football position.  Sucks and swallows observed w/ stimulation. Curdled milk emesis during feeding. Parents asked lots of questions regarding emesis, milk supply and feeding baby. Provided education. Mom encouraged to feed baby 8-12 times/24 hours and with feeding cues.  Reviewed engorgement care and monitoring voids/stools.   Patient Name: [name] [name] [id number]'s Date: 12/28/2015 Reason for consult: Follow-up assessment   Maternal Data    Feeding Feeding Type: Breast Fed  LATCH Score/Interventions Latch: Grasps breast easily, tongue down, lips flanged, rhythmical sucking.  Audible Swallowing: A few with stimulation  Type of Nipple: Everted at rest and after stimulation  Comfort (Breast/Nipple): Soft / non-tender     Hold (Positioning): Full assist, staff holds infant at breast Intervention(s): Skin to skin  LATCH Score: 7  Lactation Tools Discussed/Used     Consult Status Consult Status: Complete    Hardie PulleyBerkelhammer, Ruth Boschen 12/28/2015, 11:14 AM

## 2015-12-28 NOTE — Progress Notes (Signed)
Subjective: Postpartum Day 2: Cesarean Delivery Patient reports incisional pain, tolerating PO, + flatus and no problems voiding.    Objective: Vital signs in last 24 hours: Temp:  [97.1 F (36.2 C)-98 F (36.7 C)] 98 F (36.7 C) (10/08 0547) Pulse Rate:  [74-80] 80 (10/08 0547) Resp:  [20] 20 (10/08 0547) BP: (90-101)/(58-63) 90/63 (10/08 0547) SpO2:  [95 %] 95 % (10/07 1113)  Physical Exam:  General: alert, cooperative, appears stated age and no distress Lochia: appropriate Uterine Fundus: firm Incision: healing well, no significant drainage, no dehiscence, no significant erythema DVT Evaluation: No evidence of DVT seen on physical exam.   Recent Labs  12/26/15 1529 12/27/15 0555  HGB 7.5* 9.1*  HCT 23.9* 26.8*    Assessment/Plan: Status post Cesarean section. Doing well postoperatively.  Continue current care.  [name] [name] 12/28/2015, 10:35 AM

## 2015-12-28 NOTE — Discharge Summary (Signed)
       OB Discharge Summary  Patient Name: [name] [name] DOB: 08-Apr-1980 MRN: [id number]  Date of admission: 12/25/2015 Delivering MD: Hermina StaggersERVIN, MICHAEL L   Date of discharge: 12/28/2015  Admitting diagnosis: 40w water broke, ctx 5 min apart Intrauterine pregnancy: 6251w4d     Secondary diagnosis:Active Problems:   PROM (premature rupture of membranes)   Postpartum care following cesarean delivery  Additional problems:none     Discharge diagnosis: Term Pregnancy Delivered                                                                     Post partum procedures:none  Augmentation: n/a  Complications: None  Hospital course:  Onset of Labor With Unplanned C/S  35 y.o. yo G1P1001 at 5851w4d was admitted in Active Labor on 12/25/2015. Patient had a labor course significant for NRFHR. Membrane Rupture Time/Date: 9:00 PM ,12/25/2015   The patient went for cesarean section due to Non-Reassuring FHR, and delivered a Viable infant,12/26/2015  Details of operation can be found in separate operative note. Patient had an uncomplicated postpartum course.  She is ambulating,tolerating a regular diet, passing flatus, and urinating well.  Patient is discharged home in stable condition 12/28/15.   Physical exam Vitals:   12/27/15 0745 12/27/15 1113 12/27/15 1441 12/28/15 0547  BP: (!) 96/56 (!) 90/58 101/60 90/63  Pulse: 72 74 74 80  Resp: 20 20 20 20   Temp: 97.9 F (36.6 C) 97.6 F (36.4 C) 97.1 F (36.2 C) 98 F (36.7 C)  TempSrc: Oral  Oral Oral  SpO2: 94% 95%    Weight:      Height:       General: alert, cooperative and no distress Lochia: appropriate Uterine Fundus: firm Incision: Healing well with no significant drainage, No significant erythema, Dressing is clean, dry, and intact DVT Evaluation: No evidence of DVT seen on physical exam. Labs: Lab Results  Component Value Date   WBC 16.2 (H) 12/27/2015   HGB 9.1 (L) 12/27/2015   HCT 26.8 (L) 12/27/2015   MCV 77.5 (L) 12/27/2015   PLT  154 12/27/2015   No flowsheet data found.  Discharge instruction: per After Visit Summary and "Baby and Me Booklet".   Diet: routine diet  Activity: Advance as tolerated. Pelvic rest for 6 weeks.   Outpatient follow up:6 weeks Follow up Appt:No future appointments. Follow up visit: No Follow-up on file.  Postpartum contraception: Undecided  Newborn Data: Live born female  Birth Weight: 8 lb 4.8 oz (3765 g) APGAR: 8, 9  Baby Feeding: Breast Disposition:home with mother   12/28/2015 [name], CNM

## 2016-02-03 ENCOUNTER — Telehealth: Payer: Self-pay | Admitting: *Deleted

## 2016-02-03 NOTE — Telephone Encounter (Signed)
Pt husband called to office asking for return call.  Difficult understanding need due to language barrier.  Attempt to return call.  LM on VM to call back.

## 2016-02-19 ENCOUNTER — Ambulatory Visit: Payer: Medicaid Other | Admitting: Certified Nurse Midwife

## 2016-02-19 ENCOUNTER — Ambulatory Visit (INDEPENDENT_AMBULATORY_CARE_PROVIDER_SITE_OTHER): Payer: Medicaid Other | Admitting: Certified Nurse Midwife

## 2016-02-19 NOTE — Progress Notes (Signed)
Post Partum Exam  [name] [name] is a 35 y.o. 231P1001 female who presents for a postpartum visit. She is 6 weeks postpartum following a low cervical transverse Cesarean section. I have fully reviewed the prenatal and intrapartum course. The delivery was at 39.3 gestational weeks.  Anesthesia: spinal. Postpartum course has been doing well. Baby's course has been doing wel. Baby is feeding by breast. Bleeding no bleeding. Bowel function is normal. Bladder function is normal. Patient is sexually active. Last intercourse was this week. Contraception method is none.  pt would like to discuss Birth control options.  Postpartum depression screening:  The following portions of the patient's history were reviewed and updated as appropriate: allergies, current medications, past family history, past medical history, past social history, past surgical history and problem list.  Review of Systems Pertinent items noted in HPI and remainder of comprehensive ROS otherwise negative.    Objective:    BP 116/78 mmHg  Pulse 78  Resp 16  Ht 5\' 5"  (1.651 m)  Wt 211 lb (95.709 kg)  BMI 35.11 kg/m2  Breastfeeding? Yes  General:  alert, cooperative and no distress   Breasts:  inspection negative, no nipple discharge or bleeding, no masses or nodularity palpable  Lungs: clear to auscultation bilaterally  Heart:  regular rate and rhythm, S1, S2 normal, no murmur, click, rub or gallop  Abdomen: soft, non-tender; bowel sounds normal; no masses,  no organomegaly   Vulva:  normal  Vagina: normal vagina, no discharge, exudate, lesion, or erythema  Cervix:  no cervical motion tenderness  Corpus: normal size, contour, position, consistency, mobility, non-tender  Adnexa:  normal adnexa  Rectal Exam: Not performed.        Assessment:    Normal 6 week postpartum exam. Pap smear not done at today's visit.  Last pap: 07/30/15 Plan:   1. Contraception: abstinence 2. IUD planned 3. Follow up in: a few weeks or as needed  for IUD insertion.

## 2016-02-23 ENCOUNTER — Encounter: Payer: Self-pay | Admitting: Certified Nurse Midwife

## 2016-03-16 ENCOUNTER — Encounter: Payer: Self-pay | Admitting: Certified Nurse Midwife

## 2016-03-16 ENCOUNTER — Ambulatory Visit (INDEPENDENT_AMBULATORY_CARE_PROVIDER_SITE_OTHER): Payer: Medicaid Other | Admitting: Certified Nurse Midwife

## 2016-03-16 VITALS — BP 101/72 | HR 76 | Wt 120.0 lb

## 2016-03-16 DIAGNOSIS — Z30430 Encounter for insertion of intrauterine contraceptive device: Secondary | ICD-10-CM

## 2016-03-16 DIAGNOSIS — Z3202 Encounter for pregnancy test, result negative: Secondary | ICD-10-CM | POA: Diagnosis not present

## 2016-03-16 LAB — POCT URINE PREGNANCY: PREG TEST UR: NEGATIVE

## 2016-03-16 MED ORDER — LEVONORGESTREL 20 MCG/24HR IU IUD
INTRAUTERINE_SYSTEM | Freq: Once | INTRAUTERINE | Status: AC
  Administered 2016-03-16: 1 via INTRAUTERINE

## 2016-03-16 NOTE — Progress Notes (Signed)
IUD Procedure Note   DIAGNOSIS: Desires long-term, reversible contraception   PROCEDURE: IUD placement Performing Provider: Orvilla Cornwallachelle Iliana Hutt CNM  Patient counseled prior to procedure. I explained risks and benefits of Mirena IUD, reviewed alternative forms of contraception. Patient stated understanding and consented to continue with procedure.   LMP: 03/02/16 Pregnancy Test: Negative Lot #: [id number] Expiration Date: 06/2018   IUD type: [X]  Mirena   [  ] Paraguard  [  ] Christean GriefSkyla   [  ]  Kyleena  PROCEDURE:  Timeout procedure was performed to ensure right patient and right site.  A bimanual exam was performed to determine the position of the uterus, anteverted. The speculum was placed. The vagina and cervix was sterilized in the usual manner and sterile technique was maintained throughout the course of the procedure. A single toothed tenaculum was applied to the posterior lip of the cervix and gentle traction applied. The depth of the uterus was sounded to 9 cm. With gentle traction on the tenaculum, the IUD was inserted to the appropriate depth and inserted without difficulty.  The string was cut to an estimated 4 cm length. Bleeding was minimal. The patient tolerated the procedure well.   Follow up: The patient tolerated the procedure well without complications.  Standard post-procedure care is explained and return precautions are given.  Orvilla Cornwallachelle Chesni Vos CNM

## 2016-04-15 ENCOUNTER — Ambulatory Visit (INDEPENDENT_AMBULATORY_CARE_PROVIDER_SITE_OTHER): Payer: Medicaid Other | Admitting: Obstetrics and Gynecology

## 2016-04-15 ENCOUNTER — Encounter: Payer: Self-pay | Admitting: Obstetrics and Gynecology

## 2016-04-15 VITALS — BP 124/79 | HR 85 | Temp 97.8°F | Wt 119.5 lb

## 2016-04-15 DIAGNOSIS — Z309 Encounter for contraceptive management, unspecified: Secondary | ICD-10-CM

## 2016-04-15 DIAGNOSIS — Z30431 Encounter for routine checking of intrauterine contraceptive device: Secondary | ICD-10-CM

## 2016-04-15 NOTE — Progress Notes (Signed)
36 yo G1P1 presenting today for IUD check. Patient has had the IUD inserted on 04/17/2015. She reports feeling well. She denies pain. She has had intercourse without issues. She reports daily vaginal spotting since insertion only noticeable when wiping. Patient is still breastfeeding  Past Medical History:  Diagnosis Date  . Medical history non-contributory    Past Surgical History:  Procedure Laterality Date  . CESAREAN SECTION N/A 12/26/2015   Procedure: CESAREAN SECTION;  Surgeon: Hermina StaggersMichael L Ervin, MD;  [facility];  Service: Obstetrics;  Laterality: N/A;  . NO PAST SURGERIES     No family history on file. Social History  Substance Use Topics  . Smoking status: Never Smoker  . Smokeless tobacco: Never Used  . Alcohol use No   ROS See pertinent in HPI  Blood pressure 124/79, pulse 85, temperature 97.8 F (36.6 C), temperature source Oral, weight 119 lb 8 oz (54.2 kg), currently breastfeeding. GENERAL: Well-developed, well-nourished female in no acute distress.  ABDOMEN: Soft, nontender, nondistended.  PELVIC: Normal external female genitalia. Vagina is pink and rugated.  Normal discharge. Normal appearing cervix with IUD strings extending 2.5 cm from os. Uterus is normal in size. No adnexal mass or tenderness. EXTREMITIES: No cyanosis, clubbing, or edema, 2+ distal pulses.  A/P 36 yo here for IUD check - IUD appears to be in the appropriate location - Remind patient that irregular bleeding may be present up to 6 months following insertion - RTC for annual exam in 6 months

## 2016-04-15 NOTE — Progress Notes (Signed)
Patient is in the office for IUD follow up.

## 2016-10-06 ENCOUNTER — Other Ambulatory Visit: Payer: Self-pay | Admitting: Certified Nurse Midwife

## 2016-10-25 ENCOUNTER — Ambulatory Visit: Payer: Self-pay | Admitting: Obstetrics and Gynecology

## 2016-10-26 ENCOUNTER — Ambulatory Visit: Payer: Self-pay | Admitting: Obstetrics and Gynecology

## 2017-07-04 IMAGING — US US MFM OB DETAIL+14 WK
1 series · 14 of 28 positions shown · non-contrast
Comparison: none

[Series 1: us mfm ob detail+14 wk · 14 of 73 slices shown]
[im 3/73]
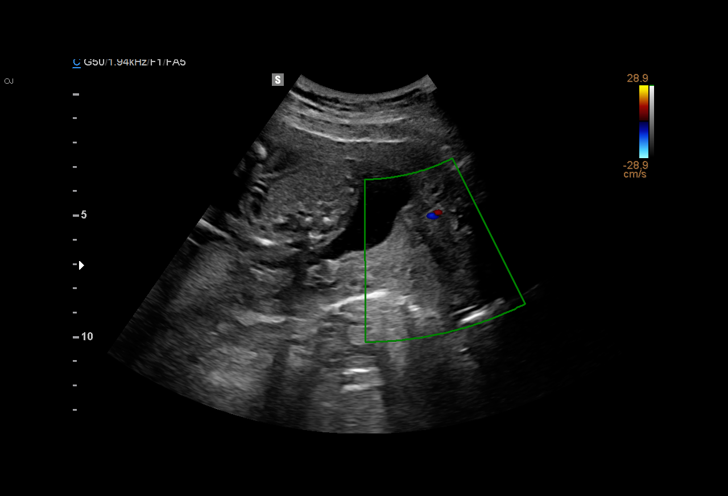
[im 9/73]
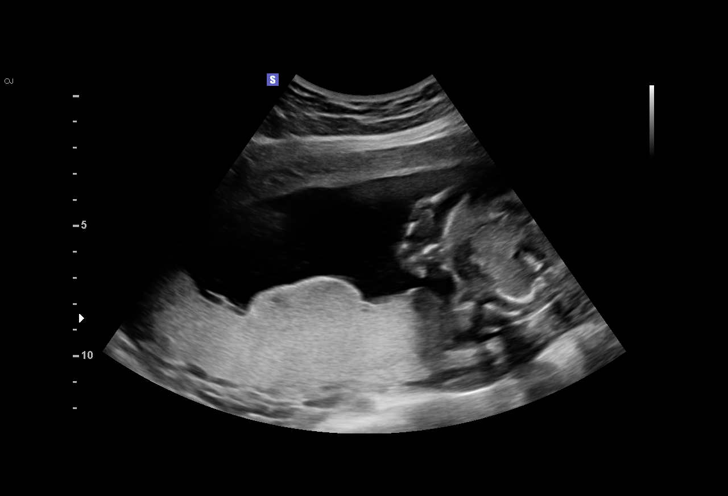
[im 14/73]
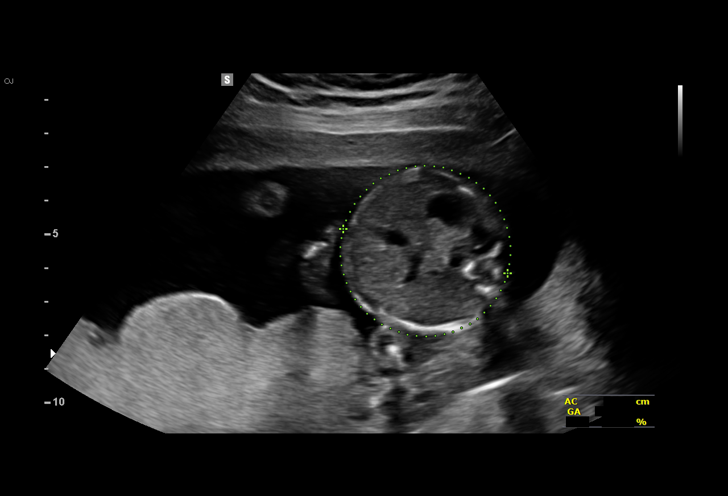
[im 19/73]
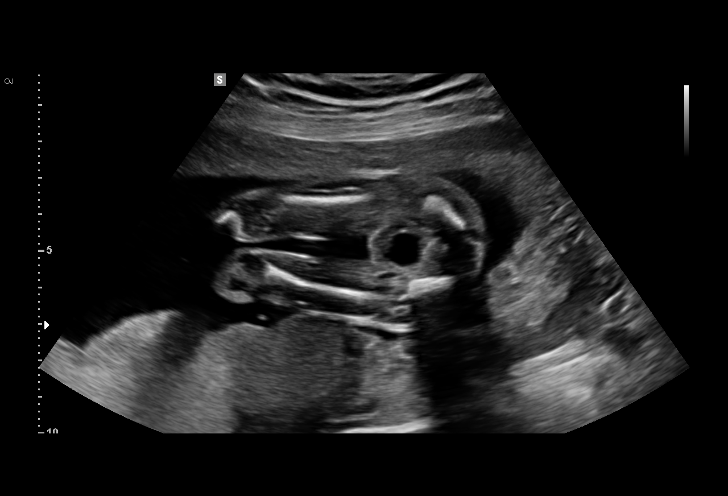
[im 25/73]
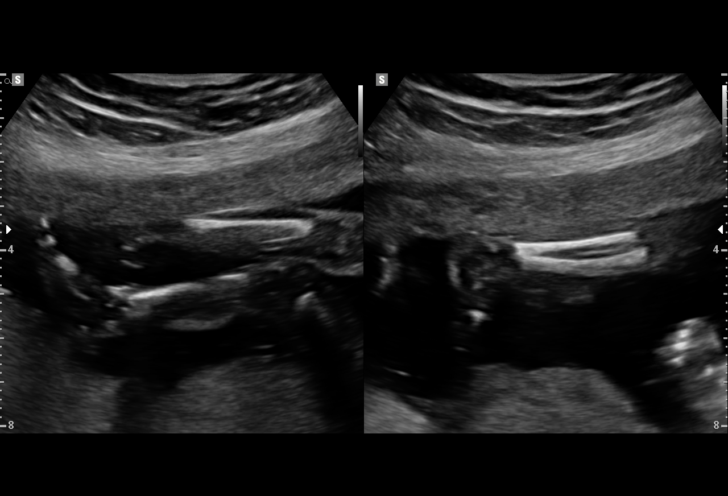
[im 30/73]
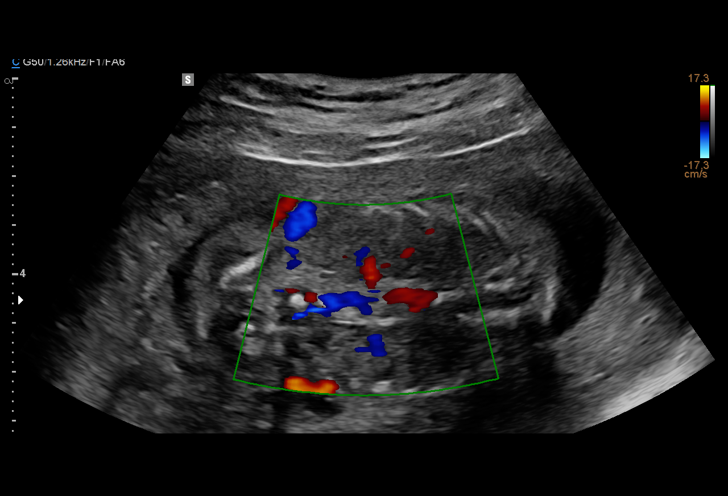
[im 35/73]
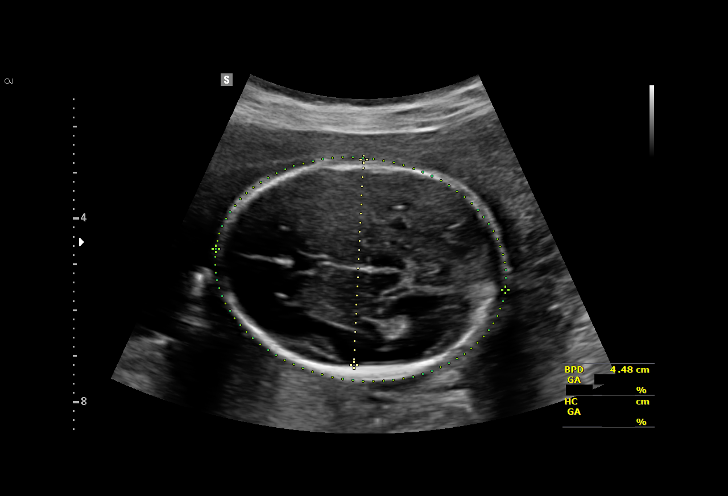
[im 41/73]
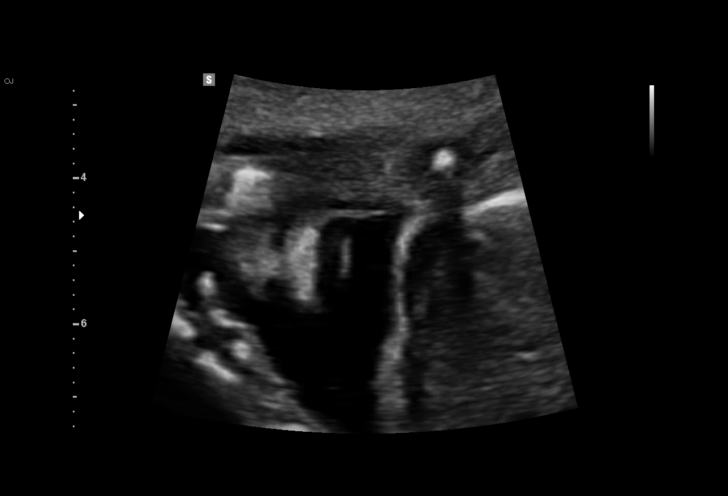
[im 46/73]
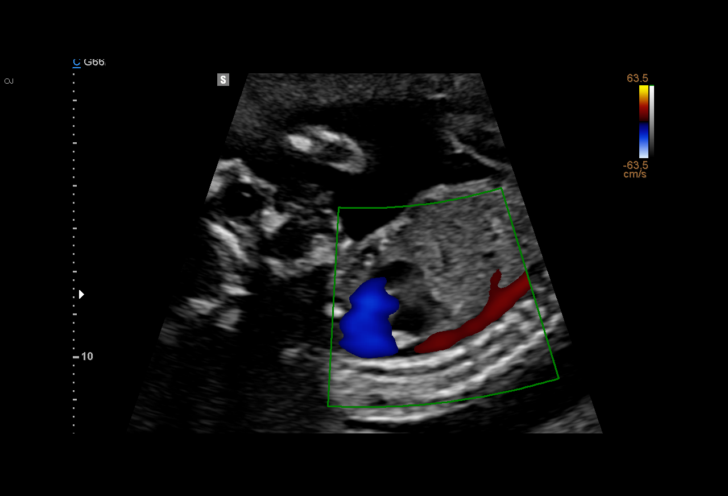
[im 51/73]
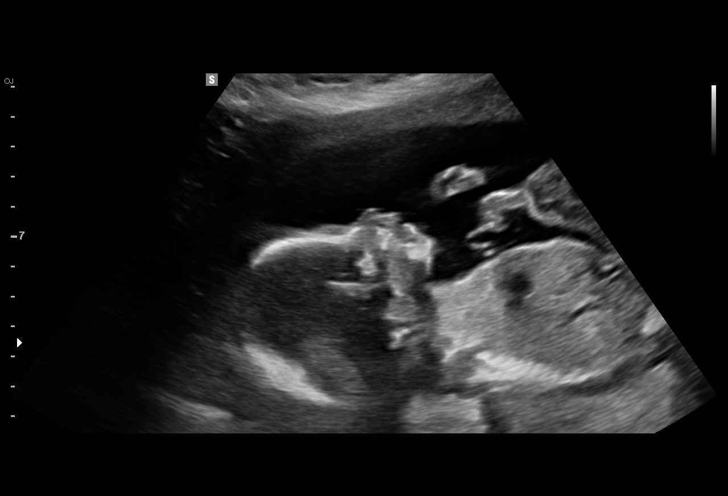
[im 57/73]
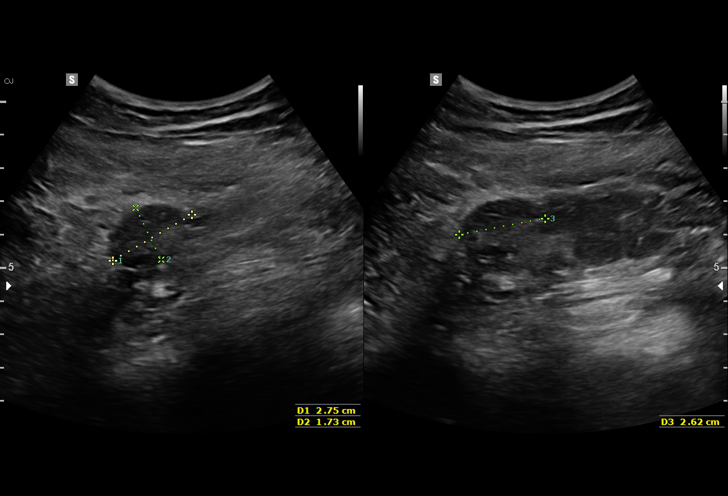
[im 62/73]
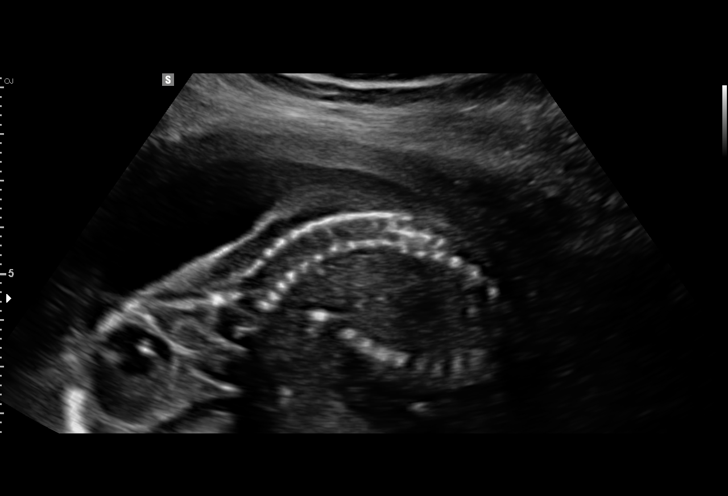
[im 67/73]
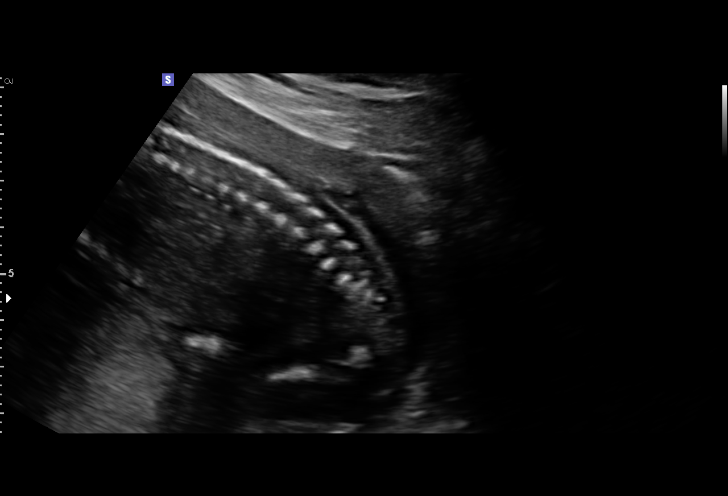
[im 73/73]
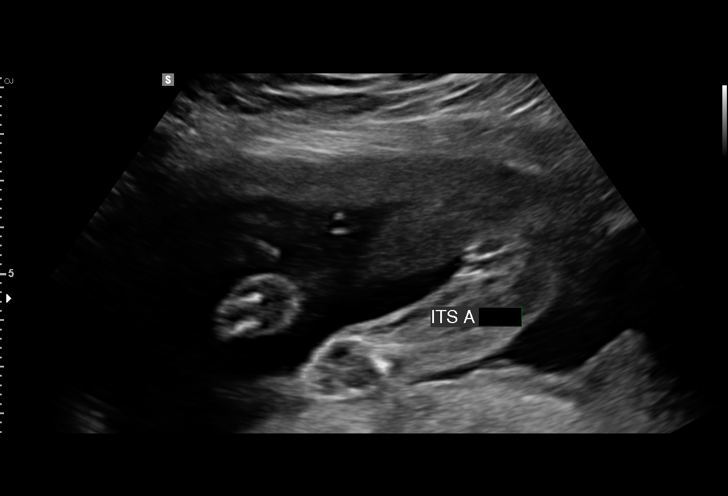

[14 of 28 positions shown; findings below may reference images not displayed]

Road [HOSPITAL]

Indications

20 weeks gestation of pregnancy
Detailed fetal anatomic survey                 Z36
Advanced maternal age primigravida (34),
second trimester-low risk nips
OB History

Gravidity:    1         Term:   0        Prem:   0        SAB:   0
TOP:          0       Ectopic:  0        Living: 0
Fetal Evaluation

Num Of Fetuses:     1
Fetal Heart         151
Rate(bpm):
Cardiac Activity:   Observed
Presentation:       Breech
Placenta:           Posterior, above cervical os
P. Cord Insertion:  Visualized

Amniotic Fluid
AFI FV:      Subjectively within normal limits

Largest Pocket(cm)
4.5
Biometry

BPD:      45.9  mm     G. Age:  19w 6d         44  %    CI:        70.49   %    70 - 86
FL/HC:      19.3   %    16.8 -
HC:      174.3  mm     G. Age:  20w 0d         41  %    HC/AC:      1.08        1.09 -
AC:      160.9  mm     G. Age:  21w 1d         80  %    FL/BPD:     73.2   %
FL:       33.6  mm     G. Age:  20w 4d         61  %    FL/AC:      20.9   %    20 - 24
HUM:      33.3  mm     G. Age:  21w 2d         87  %
CER:      22.4  mm     G. Age:  21w 1d         71  %
NFT:       4.3  mm
CM:        5.3  mm

Est. FW:     375  gm    0 lb 13 oz      59  %
Gestational Age

LMP:           20w 0d        Date:  03/24/15                 EDD:   12/29/15
U/S Today:     20w 3d                                        EDD:   12/26/15
Best:          20w 0d     Det. By:  LMP  (03/24/15)          EDD:   12/29/15
Anatomy

Cranium:               Appears normal         Aortic Arch:            Appears normal
Cavum:                 Appears normal         Ductal Arch:            Appears normal
Ventricles:            Appears normal         Diaphragm:              Appears normal
Choroid Plexus:        Appears normal         Stomach:                Appears normal, left
sided
Cerebellum:            Appears normal         Abdomen:                Appears normal
Posterior Fossa:       Appears normal         Abdominal Wall:         Appears nml (cord
insert, abd wall)
Nuchal Fold:           Appears normal         Cord Vessels:           Appears normal (3
vessel cord)
Face:                  Appears normal         Kidneys:                Appear normal
(orbits and profile)
Lips:                  Appears normal         Bladder:                Appears normal
Thoracic:              Appears normal         Spine:                  Limited views
appear normal
Heart:                 Appears normal         Upper Extremities:      Appears normal
(4CH, axis, and
situs)
RVOT:                  Appears normal         Lower Extremities:      Appears normal
LVOT:                  Appears normal

Other:  Fetus appears to be a female. Heels visualized.
Cervix Uterus Adnexa

Cervix
Length:            4.1  cm.
Normal appearance by transabdominal scan.
Impression

SIUP at 14w4d, AMA (age 35-39), low risk NIPS
EFW 59th%
no dysmorphic features demonstrated
no previa
Recommendations

1. see genetic counseling as per request
2. interval growth q6 weeks.

## 2017-08-17 IMAGING — US US MFM OB FOLLOW-UP
1 series · 14 of 28 positions shown · non-contrast
Comparison: none

[Series 1: us mfm ob follow-up · 44 acquisitions, 14 frames shown]
[im 2/44]
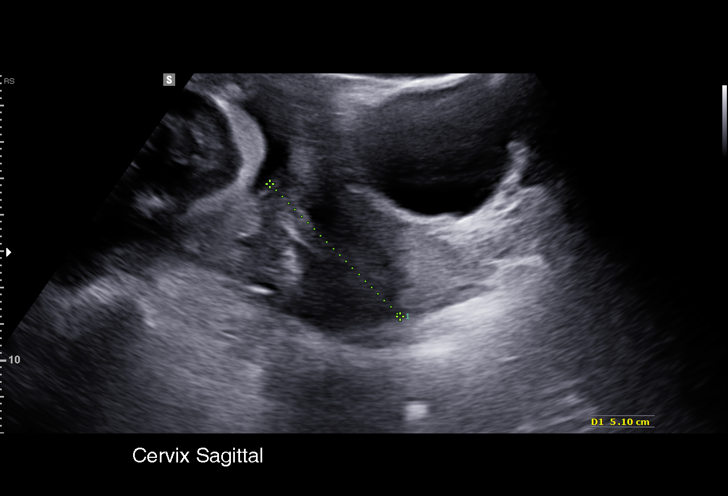
[im 5/44]
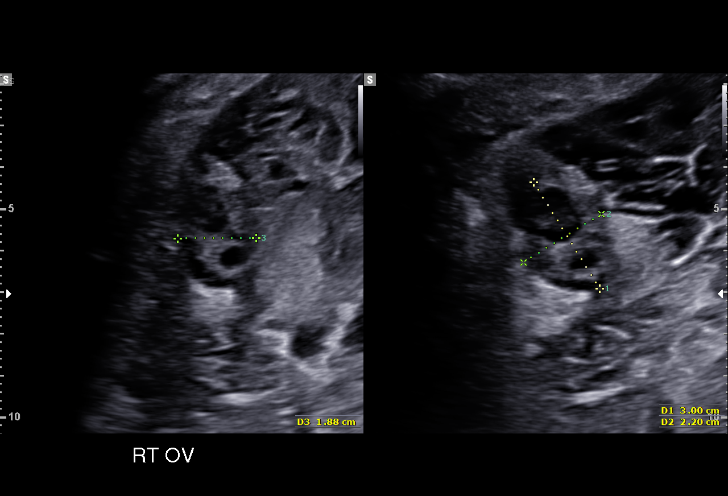
[im 8/44]
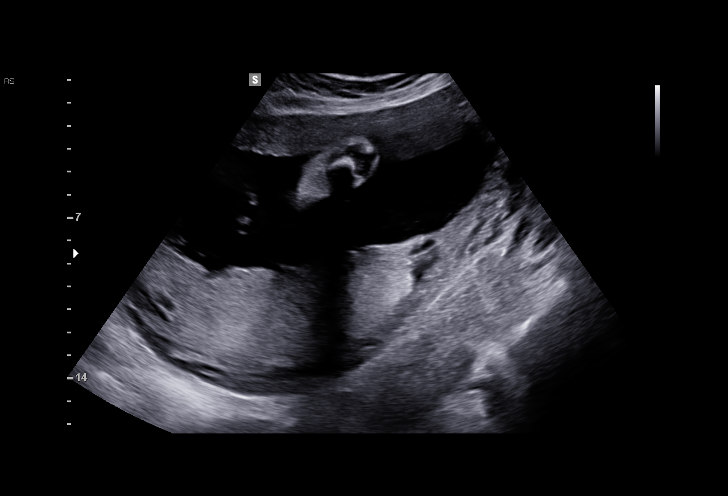
[im 12/44]
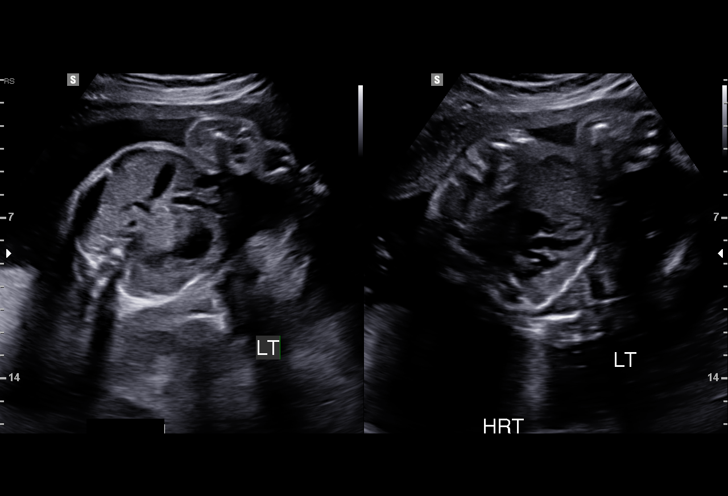
[im 15/44]
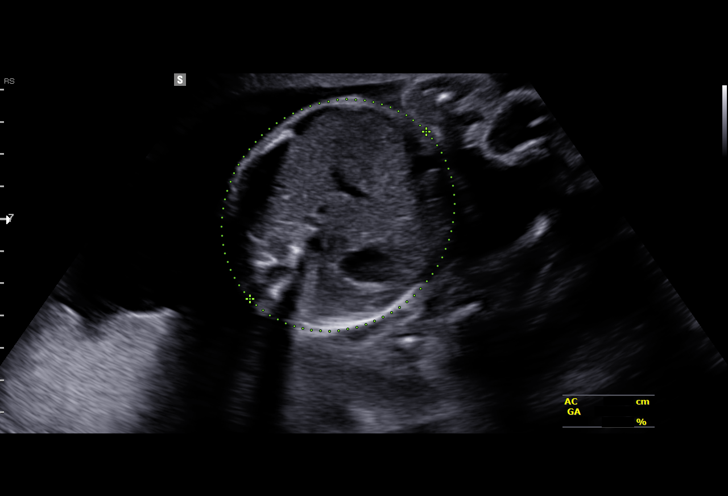
[im 18/44]
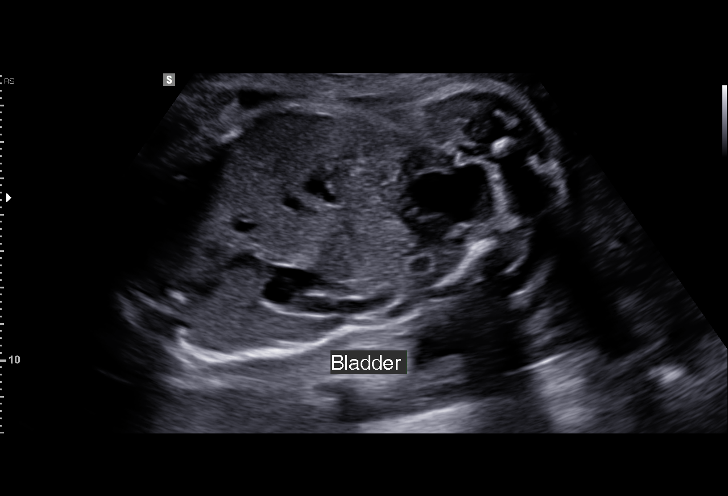
[im 21/44]
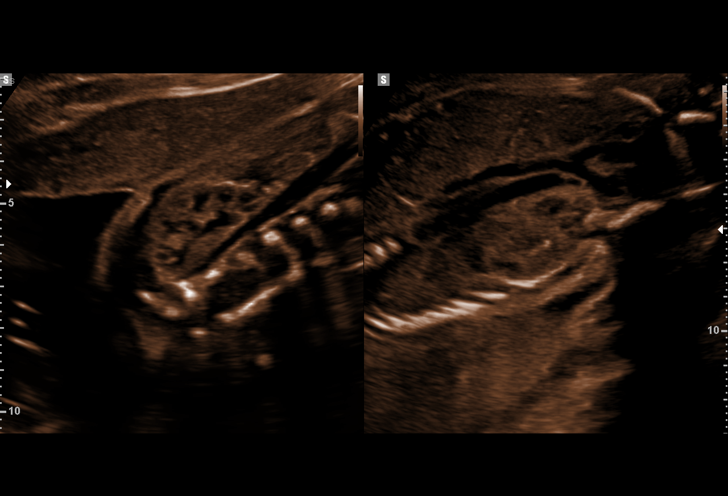
[im 24/44]
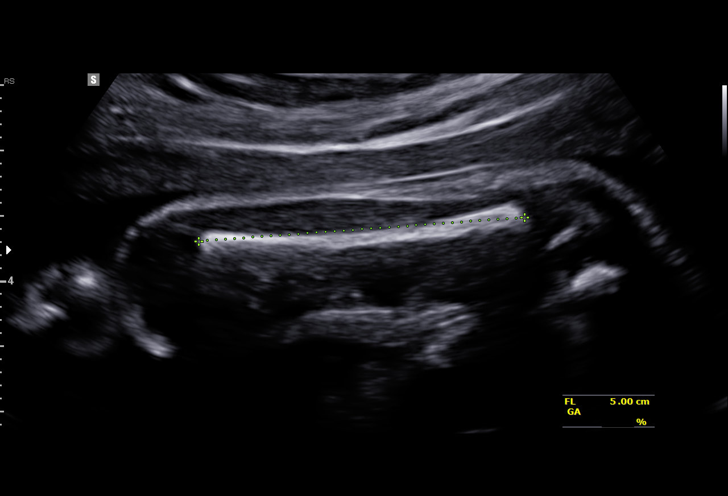
[im 28/44]
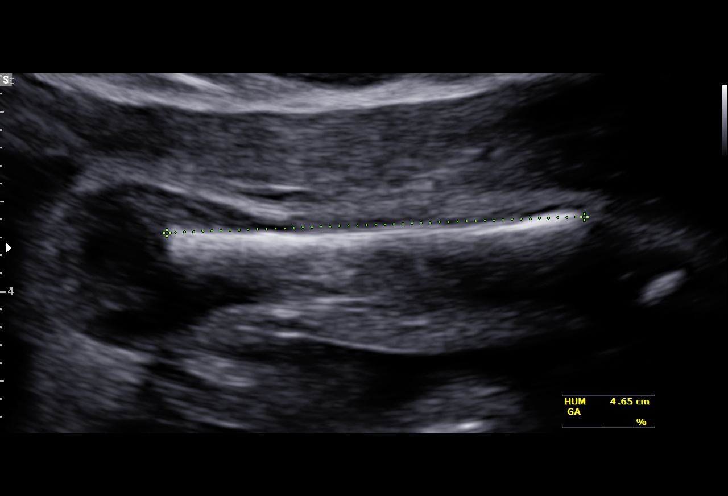
[im 31/44]
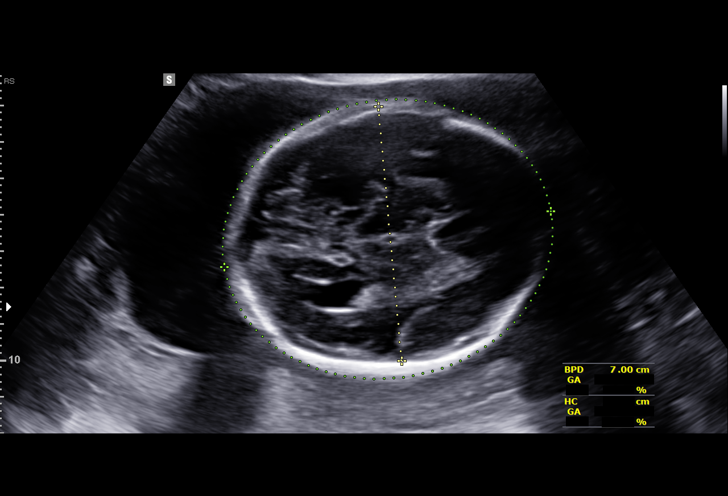
[im 34/44]
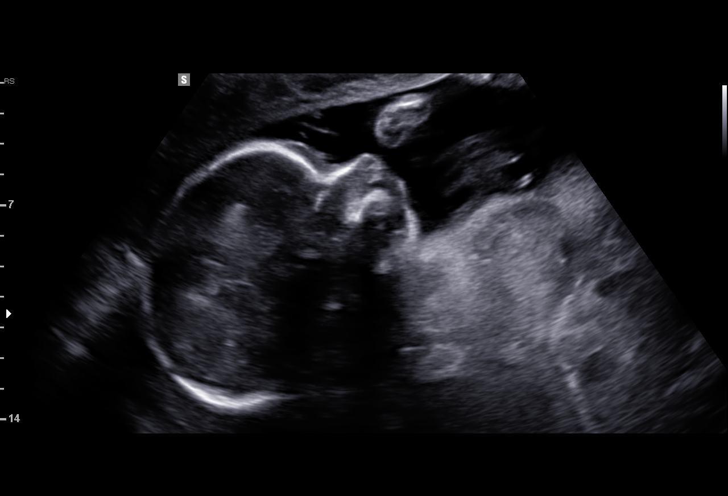
[im 37/44]
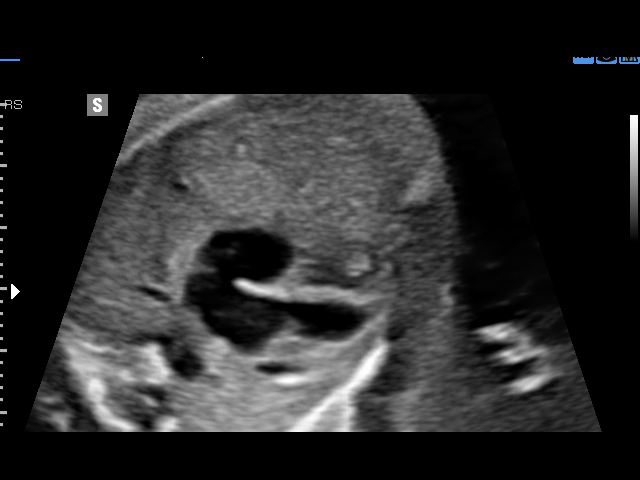
[im 40/44]
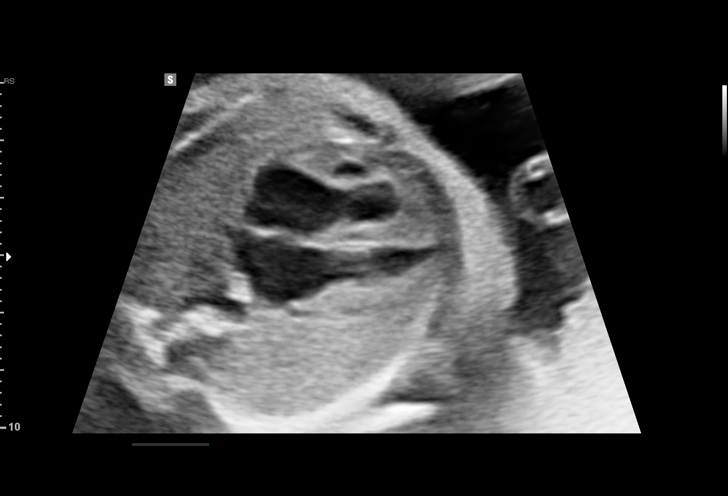
[im 44/44]
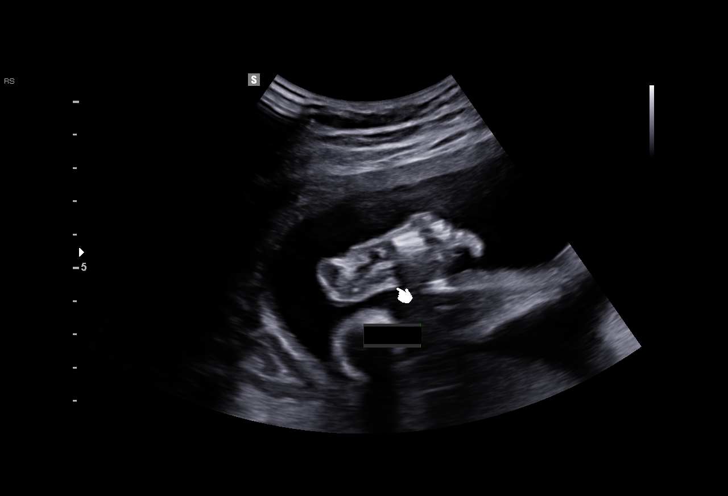

[14 of 28 positions shown; findings below may reference images not displayed]

Road [HOSPITAL]

Indications

26 weeks gestation of pregnancy
Advanced maternal age primigravida (34),
second trimester-low risk NIPS
Follow-up incomplete fetal anatomic            Z36
evaluation
OB History

Gravidity:    1         Term:   0        Prem:   0        SAB:   0
TOP:          0       Ectopic:  0        Living: 0
Fetal Evaluation

Num Of Fetuses:     1
Fetal Heart         143
Rate(bpm):
Cardiac Activity:   Observed
Presentation:       Oblique/Breech
Placenta:           Posterior, above cervical os

Amniotic Fluid
AFI FV:      Subjectively within normal limits

Largest Pocket(cm)
7.24
Biometry

BPD:      69.4  mm     G. Age:  27w 6d         88  %    CI:         70.1   %   70 - 86
FL/HC:      18.8   %   18.6 -
HC:      264.4  mm     G. Age:  28w 5d         93  %    HC/AC:      1.15       1.04 -
AC:       230   mm     G. Age:  27w 3d         74  %    FL/BPD:     71.5   %   71 - 87
FL:       49.6  mm     G. Age:  26w 5d         50  %    FL/AC:      21.6   %   20 - 24
HUM:      46.3  mm     G. Age:  27w 2d         69  %

Est. FW:    5603  gm      2 lb 5 oz     75  %
Gestational Age

LMP:           26w 2d       Date:   03/24/15                 EDD:   12/29/15
U/S Today:     27w 5d                                        EDD:   12/19/15
Best:          26w 2d    Det. By:   LMP  (03/24/15)          EDD:   12/29/15
Anatomy

Cranium:               Appears normal         Aortic Arch:            Previously seen
Cavum:                 Previously seen        Ductal Arch:            Previously seen
Ventricles:            Appears normal         Diaphragm:              Previously seen
Choroid Plexus:        Previously seen        Stomach:                Appears normal, left
sided
Cerebellum:            Previously seen        Abdomen:                Previously seen
Posterior Fossa:       Previously seen        Abdominal Wall:         Previously seen
Nuchal Fold:           Not applicable (>20    Cord Vessels:           Previously seen
wks GA)
Face:                  Orbits and profile     Kidneys:                Appear normal
previously seen
Lips:                  Previously seen        Bladder:                Appears normal
Thoracic:              Appears normal         Spine:                  Limited views
previously seen
Heart:                 Appears normal         Upper Extremities:      Previously seen
(4CH, axis, and
situs)
RVOT:                  Previously seen        Lower Extremities:      Previously seen
LVOT:                  Previously seen

Other:  Fetus appears to be a female. Heels previously visualized.
Cervix Uterus Adnexa

Cervix
Length:            5.1  cm.
Normal appearance by transabdominal scan.

Uterus
No abnormality visualized.

Left Ovary
Within normal limits.

Right Ovary
Within normal limits.

Adnexa:       No abnormality visualized. No adnexal mass
visualized.
Impression

SIUP at 26+2 weeks
Normal interval anatomy; anatomic survey complete
Normal amniotic fluid volume
Appropriate interval growth with EFW at the 75th %tile
Recommendations

Follow-up ultrasound for growth in 6 weeks (AMA)

## 2019-01-16 ENCOUNTER — Telehealth: Payer: Self-pay

## 2019-01-16 NOTE — Telephone Encounter (Signed)
Returned call and husband, Blon had questions about BC, stated he will call back when he is with pt.

## 2020-05-02 ENCOUNTER — Other Ambulatory Visit: Payer: Self-pay

## 2020-05-02 ENCOUNTER — Encounter: Payer: Self-pay | Admitting: Obstetrics and Gynecology

## 2020-05-02 ENCOUNTER — Ambulatory Visit (INDEPENDENT_AMBULATORY_CARE_PROVIDER_SITE_OTHER): Payer: BC Managed Care – PPO | Admitting: Obstetrics and Gynecology

## 2020-05-02 ENCOUNTER — Other Ambulatory Visit (HOSPITAL_COMMUNITY)
Admission: RE | Admit: 2020-05-02 | Discharge: 2020-05-02 | Disposition: A | Discharge status: Home / Self Care | Payer: BC Managed Care – PPO | Source: Ambulatory Visit | Attending: Obstetrics and Gynecology | Admitting: Obstetrics and Gynecology

## 2020-05-02 VITALS — BP 128/72 | HR 71 | Ht 60.0 in | Wt 111.0 lb

## 2020-05-02 DIAGNOSIS — Z124 Encounter for screening for malignant neoplasm of cervix: Secondary | ICD-10-CM | POA: Diagnosis not present

## 2020-05-02 DIAGNOSIS — Z01419 Encounter for gynecological examination (general) (routine) without abnormal findings: Secondary | ICD-10-CM

## 2020-05-02 DIAGNOSIS — T8332XA Displacement of intrauterine contraceptive device, initial encounter: Secondary | ICD-10-CM

## 2020-05-02 NOTE — Progress Notes (Signed)
GYNECOLOGY ANNUAL PREVENTATIVE CARE ENCOUNTER NOTE  History:     [name] [name] is a 40 y.o. G65P1001 female here for a routine annual gynecologic exam.  Current complaints: None.   Denies abnormal vaginal bleeding, discharge, pelvic pain, problems with intercourse. Unable to feel IUD strings. Was able to in the beginning however hasn't been able to recently.  Interpretor present for visit.   Gynecologic History No LMP recorded (exact date). (Menstrual status: IUD). Contraception: IUD Last Pap: 07/30/2015 Results were: normal with negative HPV Last mammogram: NA, ordered today.   Obstetric History OB History  Gravida Para Term Preterm AB Living  1 1 1  0 0 1  SAB IAB Ectopic Multiple Live Births  0 0 0 0 1    # Outcome Date GA Lbr Len/2nd Weight Sex Delivery Anes PTL Lv  1 Term 12/26/15 [redacted]w[redacted]d  8 lb 4.8 oz (3.765 kg) F CS-LTranv EPI  LIV    Past Medical History:  Diagnosis Date  . Medical history non-contributory     Past Surgical History:  Procedure Laterality Date  . CESAREAN SECTION N/A 12/26/2015   Procedure: CESAREAN SECTION;  Surgeon: 02/25/2016, MD;  Location: [facility] BIRTHING SUITES;  Service: Obstetrics;  Laterality: N/A;  . NO PAST SURGERIES      Current Outpatient Medications on File Prior to Visit  Medication Sig Dispense Refill  . diphenhydrAMINE (BENADRYL) 25 MG tablet Take 25 mg by mouth every 6 (six) hours as needed for allergies or sleep. (Patient not taking: Reported on 05/02/2020)    . ibuprofen (ADVIL,MOTRIN) 600 MG tablet Take 1 tablet (600 mg total) by mouth every 6 (six) hours. (Patient not taking: Reported on 05/02/2020) 30 tablet 0  . Iron-FA-B Cmp-C-Biot-Probiotic (FUSION PLUS) CAPS Take 1 tablet by mouth daily. (Patient not taking: No sig reported) 30 capsule 12  . oxyCODONE-acetaminophen (PERCOCET/ROXICET) 5-325 MG tablet Take 1-2 tablets by mouth every 4 (four) hours as needed for moderate pain or severe pain. (Patient not taking: No sig reported) 30  tablet 0  . Prenat-FePoly-Metf-FA-DHA-DSS (VITAFOL FE+) 90-1-200 & 50 MG CPPK Take 2 tablets by mouth daily. (Patient not taking: No sig reported) 60 each 12   No current facility-administered medications on file prior to visit.    No Known Allergies  Social History:  reports that she has never smoked. She has never used smokeless tobacco. She reports that she does not drink alcohol and does not use drugs.  History reviewed. No pertinent family history.  The following portions of the patient's history were reviewed and updated as appropriate: allergies, current medications, past family history, past medical history, past social history, past surgical history and problem list.  Review of Systems Pertinent items noted in HPI and remainder of comprehensive ROS otherwise negative.  Physical Exam:  BP 128/72 (BP Location: Right Arm, Patient Position: Sitting, Cuff Size: Normal)   Pulse 71   Ht 5' (1.524 m)   Wt 111 lb (50.3 kg)   LMP  (Exact Date)   Breastfeeding No   BMI 21.68 kg/m  CONSTITUTIONAL: Well-developed, well-nourished female in no acute distress.  HENT:  Normocephalic, atraumatic, External right and left ear normal.  EYES: Conjunctivae and EOM are normal. Pupils are equal, round, and reactive to light. No scleral icterus.  NECK: Normal range of motion, supple, no masses.  Normal thyroid.  SKIN: Skin is warm and dry. No rash noted. Not diaphoretic. No erythema. No pallor. MUSCULOSKELETAL: Normal range of motion. No tenderness.  No cyanosis,  clubbing, or edema. NEUROLOGIC: Alert and oriented to person, place, and time. Normal reflexes, muscle tone coordination.  PSYCHIATRIC: Normal mood and affect. Normal behavior. Normal judgment and thought content. CARDIOVASCULAR: Normal heart rate noted, regular rhythm RESPIRATORY: Clear to auscultation bilaterally. Effort and breath sounds normal, no problems with respiration noted. BREASTS: Symmetric in size. No masses, tenderness, skin  changes, nipple drainage, or lymphadenopathy bilaterally. Performed in the presence of a chaperone. ABDOMEN: Soft, no distention noted.  No tenderness, rebound or guarding.  PELVIC: Normal appearing external genitalia and urethral meatus; normal appearing vaginal mucosa and cervix.  No abnormal discharge noted.  Pap smear obtained.  Normal uterine size, no other palpable masses, no uterine or adnexal tenderness. No IUD strings visualized or palpated.   Performed in the presence of a chaperone.   Assessment and Plan:    1. Women's annual routine gynecological examination  - MM 3D SCREEN BREAST BILATERAL; Future - Hepatitis C Antibody - Cytology - PAP with STD screening.   2. Intrauterine contraceptive device threads lost, initial encounter  - US PELVIC COMPLETE WITH TRANSVAGINAL; Future   Will follow up results of pap smear and manage accordingly. Mammogram scheduled Routine preventative health maintenance measures emphasized. Please refer to After Visit Summary for other counseling recommendations.       [name] [name], [name] Rutherford, NP [facility] [employer], [facility]

## 2020-05-03 LAB — HEPATITIS C ANTIBODY: Hep C Virus Ab: <0.1 s/co ratio (ref 0.0–0.9)

## 2020-05-06 LAB — CYTOLOGY - PAP
Chlamydia: NEGATIVE
Comment: NEGATIVE
Comment: NEGATIVE
Comment: NORMAL
Diagnosis: NEGATIVE
High risk HPV: NEGATIVE
Neisseria Gonorrhea: NEGATIVE

## 2020-05-15 ENCOUNTER — Ambulatory Visit
Admission: RE | Admit: 2020-05-15 | Discharge: 2020-05-15 | Disposition: A | Discharge status: Home / Self Care | Payer: BC Managed Care – PPO | Source: Ambulatory Visit | Attending: Obstetrics and Gynecology | Admitting: Obstetrics and Gynecology

## 2020-05-15 ENCOUNTER — Other Ambulatory Visit: Payer: Self-pay

## 2020-05-15 DIAGNOSIS — T8332XA Displacement of intrauterine contraceptive device, initial encounter: Secondary | ICD-10-CM | POA: Diagnosis present
